# Patient Record
Sex: Male | Born: 1970 | Race: White | Hispanic: No | Smoking: Never smoker
Health system: Southern US, Community
[De-identification: ages and names within clinical notes are randomized; demographics above are authoritative.]

## PROBLEM LIST (undated history)

## (undated) DIAGNOSIS — F1090 Alcohol use, unspecified, uncomplicated: Secondary | ICD-10-CM

## (undated) DIAGNOSIS — M549 Dorsalgia, unspecified: Secondary | ICD-10-CM

## (undated) DIAGNOSIS — F1910 Other psychoactive substance abuse, uncomplicated: Secondary | ICD-10-CM

## (undated) DIAGNOSIS — M21372 Foot drop, left foot: Secondary | ICD-10-CM

## (undated) DIAGNOSIS — Z9889 Other specified postprocedural states: Secondary | ICD-10-CM

## (undated) DIAGNOSIS — D649 Anemia, unspecified: Secondary | ICD-10-CM

## (undated) DIAGNOSIS — D0420 Carcinoma in situ of skin of unspecified ear and external auricular canal: Secondary | ICD-10-CM

## (undated) HISTORY — PX: FACIAL FRACTURE SURGERY: SHX1570

## (undated) HISTORY — DX: Foot drop, left foot: M21.372

## (undated) HISTORY — PX: TRACHEOSTOMY: SUR1362

## (undated) HISTORY — DX: Other specified postprocedural states: Z98.890

## (undated) HISTORY — DX: Carcinoma in situ of skin of unspecified ear and external auricular canal: D04.20

## (undated) HISTORY — DX: Alcohol use, unspecified, uncomplicated: F10.90

## (undated) HISTORY — DX: Anemia, unspecified: D64.9

## (undated) HISTORY — DX: Other psychoactive substance abuse, uncomplicated: F19.10

---

## 2005-05-30 HISTORY — PX: FACIAL FRACTURE SURGERY: SHX1570

## 2016-11-14 ENCOUNTER — Emergency Department (HOSPITAL_COMMUNITY): Payer: Self-pay

## 2016-11-14 ENCOUNTER — Emergency Department (HOSPITAL_COMMUNITY)
Admission: EM | Admit: 2016-11-14 | Discharge: 2016-11-14 | Disposition: A | Discharge status: Home / Self Care | Payer: Self-pay | Attending: Emergency Medicine | Admitting: Emergency Medicine

## 2016-11-14 ENCOUNTER — Encounter (HOSPITAL_COMMUNITY): Payer: Self-pay

## 2016-11-14 DIAGNOSIS — N451 Epididymitis: Secondary | ICD-10-CM | POA: Insufficient documentation

## 2016-11-14 LAB — URINALYSIS, ROUTINE W REFLEX MICROSCOPIC
Bilirubin Urine: NEGATIVE
GLUCOSE, UA: NEGATIVE mg/dL
HGB URINE DIPSTICK: NEGATIVE
Ketones, ur: 5 mg/dL — AB
Nitrite: NEGATIVE
Protein, ur: NEGATIVE mg/dL
SQUAMOUS EPITHELIAL / LPF: NONE SEEN
Specific Gravity, Urine: 1.011 (ref 1.005–1.030)
pH: 7 (ref 5.0–8.0)

## 2016-11-14 MED ORDER — LIDOCAINE HCL (PF) 1 % IJ SOLN
2.0000 mL | Freq: Once | INTRAMUSCULAR | Status: AC
  Administered 2016-11-14: 0.9 mL
  Filled 2016-11-14: qty 5

## 2016-11-14 MED ORDER — CEFTRIAXONE SODIUM 250 MG IJ SOLR
250.0000 mg | Freq: Once | INTRAMUSCULAR | Status: AC
  Administered 2016-11-14: 250 mg via INTRAMUSCULAR
  Filled 2016-11-14: qty 250

## 2016-11-14 MED ORDER — DOXYCYCLINE HYCLATE 100 MG PO CAPS: 100.0000 mg | ORAL_CAPSULE | Freq: Two times a day (BID) | ORAL | 0 refills | Status: DC

## 2016-11-14 NOTE — ED Provider Notes (Signed)
[facility]-EMERGENCY DEPT Provider Note   CSN: [id number] Arrival date & time: 11/14/16  1050  By signing my name below, I, Zachary Romero, attest that this documentation has been prepared under the direction and in the presence of Rolan Bucco, MD. Electronically Signed: Modena Romero, Scribe. 11/14/2016. 2:06 PM.  History   Chief Complaint Chief Complaint  Patient presents with  . possible hernia?   The history is provided by the patient. No language interpreter was used.   HPI Comments: Zachary Romero is a 46 y.o. male who presents to the Emergency Department complaining of intermittent moderate right groin pain that started about a month ago. He states he has been exercising and initially suspected overuse strain. His pain worsened, becoming more persistent and spreading to his right testicle. He reports associated vomiting 3 days ago and mild back pain. He admits to unprotected intercourse, but with one partner, his girlfriend. Denies any hx of groin pain, groin swelling/lumps, pain with BM, fever, or other complaints at this time.  History reviewed. No pertinent past medical history.  There are no active problems to display for this patient.   History reviewed. No pertinent surgical history.     Home Medications    Prior to Admission medications   Medication Sig Start Date End Date Taking? Authorizing Provider  doxycycline (VIBRAMYCIN) 100 MG capsule Take 1 capsule (100 mg total) by mouth 2 (two) times daily. One po bid x 10 days 11/14/16   Rolan Bucco, MD    Family History No family history on file.  Social History Social History  Substance Use Topics  . Smoking status: Never Smoker  . Smokeless tobacco: Never Used  . Alcohol use Not on file     Allergies   Patient has no known allergies.   Review of Systems Review of Systems  Constitutional: Negative for chills, diaphoresis, fatigue and fever.  HENT: Negative for congestion, rhinorrhea and sneezing.   Eyes:  Negative.   Respiratory: Negative for cough, chest tightness and shortness of breath.   Cardiovascular: Negative for chest pain and leg swelling.  Gastrointestinal: Positive for vomiting. Negative for abdominal pain, blood in stool, diarrhea and nausea.  Genitourinary: Positive for testicular pain (right). Negative for difficulty urinating, flank pain, frequency, hematuria, penile swelling and scrotal swelling.  Musculoskeletal: Positive for back pain. Negative for arthralgias.  Skin: Negative for rash.  Neurological: Negative for dizziness, speech difficulty, weakness, numbness and headaches.     Physical Exam Updated Vital Signs BP (!) 142/79   Pulse 69   Temp 98.5 F (36.9 C) (Oral)   Resp 18   SpO2 98%   Physical Exam  Constitutional: He is oriented to person, place, and time. He appears well-developed and well-nourished.  HENT:  Head: Normocephalic and atraumatic.  Eyes: Pupils are equal, round, and reactive to light.  Neck: Normal range of motion. Neck supple.  Cardiovascular: Normal rate, regular rhythm and normal heart sounds.   Pulmonary/Chest: Effort normal and breath sounds normal. No respiratory distress. He has no wheezes. He has no rales. He exhibits no tenderness.  Abdominal: Soft. Bowel sounds are normal. There is no tenderness. There is no rebound and no guarding.  Genitourinary: Penis normal. Circumcised. No discharge found.  Genitourinary Comments: TTP over the right testicle and right epididymis. No swelling or abnormal lie of the testicle. No hernia palpated. Normal circumcised penis with no discharge.   Musculoskeletal: Normal range of motion. He exhibits no edema.  Lymphadenopathy:    He has no  cervical adenopathy.  Neurological: He is alert and oriented to person, place, and time.  Skin: Skin is warm and dry. No rash noted.  Psychiatric: He has a normal mood and affect.     ED Treatments / Results  DIAGNOSTIC STUDIES: Oxygen Saturation is 98% on RA,  normal by my interpretation.    COORDINATION OF CARE: 2:10 PM- Pt advised of plan for treatment and pt agrees.  Labs (all labs ordered are listed, but only abnormal results are displayed) Labs Reviewed  URINALYSIS, ROUTINE W REFLEX MICROSCOPIC - Abnormal; Notable for the following:       Result Value   Ketones, ur 5 (*)    Leukocytes, UA TRACE (*)    Bacteria, UA RARE (*)    All other components within normal limits    EKG  EKG Interpretation None       Radiology [place] Scrotum  Result Date: 11/14/2016 CLINICAL DATA:  Right scrotal pain. EXAM: SCROTAL ULTRASOUND DOPPLER ULTRASOUND OF THE TESTICLES TECHNIQUE: Complete ultrasound examination of the testicles, epididymis, and other scrotal structures was performed. Color and spectral Doppler ultrasound were also utilized to evaluate blood flow to the testicles. COMPARISON:  No recent prior . FINDINGS: Right testicle Measurements: 5.0 x 1.8 x 3.1 cm. Slightly heterogeneous parenchymal pattern. No mass or microlithiasis visualized. Left testicle Measurements: 4.7 x 2.3 x 2.9 cm. Slightly heterogeneous parenchymal pattern. No mass or microlithiasis visualized. Right epididymis:  Normal in size and appearance. Left epididymis:  Normal in size and appearance. Hydrocele:  Small bilateral hydroceles. Varicocele:  None visualized. Pulsed Doppler interrogation of both testes demonstrates normal low resistance arterial and venous waveforms bilaterally. IMPRESSION: No evidence of testicular mass or torsion. All bilateral hydroceles. Electronically Signed   By: Maisie Fushomas  Register   On: 11/14/2016 15:34   [place] Art/ven Flow Abd Pelv Doppler  Result Date: 11/14/2016 CLINICAL DATA:  Right scrotal pain. EXAM: SCROTAL ULTRASOUND DOPPLER ULTRASOUND OF THE TESTICLES TECHNIQUE: Complete ultrasound examination of the testicles, epididymis, and other scrotal structures was performed. Color and spectral Doppler ultrasound were also utilized to evaluate blood flow to the  testicles. COMPARISON:  No recent prior . FINDINGS: Right testicle Measurements: 5.0 x 1.8 x 3.1 cm. Slightly heterogeneous parenchymal pattern. No mass or microlithiasis visualized. Left testicle Measurements: 4.7 x 2.3 x 2.9 cm. Slightly heterogeneous parenchymal pattern. No mass or microlithiasis visualized. Right epididymis:  Normal in size and appearance. Left epididymis:  Normal in size and appearance. Hydrocele:  Small bilateral hydroceles. Varicocele:  None visualized. Pulsed Doppler interrogation of both testes demonstrates normal low resistance arterial and venous waveforms bilaterally. IMPRESSION: No evidence of testicular mass or torsion. All bilateral hydroceles. Electronically Signed   By: Maisie Fushomas  Register   On: 11/14/2016 15:34    Procedures Procedures (including critical care time)  Medications Ordered in ED Medications  cefTRIAXone (ROCEPHIN) injection 250 mg (not administered)     Initial Impression / Assessment and Plan / ED Course  I have reviewed the triage vital signs and the nursing notes.  Pertinent labs & imaging results that were available during my care of the patient were reviewed by me and considered in my medical decision making (see chart for details).     Patient's exam is consistent with epididymitis. I don't palpate a hernia. His urine is negative for infection or hematuria which would be less likely to indicate a kidney stone. I will treat him with Rocephin and doxycycline. He was encouraged to follow-up with the urologist. Return precautions were  given.  Final Clinical Impressions(s) / ED Diagnoses   Final diagnoses:  Epididymitis    New Prescriptions New Prescriptions   DOXYCYCLINE (VIBRAMYCIN) 100 MG CAPSULE    Take 1 capsule (100 mg total) by mouth 2 (two) times daily. One po bid x 10 days   I personally performed the services described in this documentation, which was scribed in my presence.  The recorded information has been reviewed and  considered.     Rolan Bucco, MD 11/14/16 1714

## 2016-11-14 NOTE — ED Triage Notes (Signed)
Patient here with right groin/scrotum pain intermittently x 1 month. States that the pain occurs when he lifts or with exertion. No distress on arrival

## 2016-11-14 NOTE — ED Notes (Signed)
Patient transported to [facility] 

## 2017-04-14 ENCOUNTER — Encounter (HOSPITAL_COMMUNITY): Payer: Self-pay | Admitting: Emergency Medicine

## 2017-04-14 ENCOUNTER — Other Ambulatory Visit: Payer: Self-pay

## 2017-04-14 ENCOUNTER — Emergency Department (HOSPITAL_COMMUNITY): Payer: No Typology Code available for payment source

## 2017-04-14 DIAGNOSIS — R0789 Other chest pain: Secondary | ICD-10-CM | POA: Diagnosis not present

## 2017-04-14 DIAGNOSIS — F1490 Cocaine use, unspecified, uncomplicated: Secondary | ICD-10-CM | POA: Insufficient documentation

## 2017-04-14 DIAGNOSIS — M25561 Pain in right knee: Secondary | ICD-10-CM | POA: Diagnosis present

## 2017-04-14 LAB — BASIC METABOLIC PANEL
Anion gap: 11 (ref 5–15)
BUN: 9 mg/dL (ref 6–20)
CO2: 25 mmol/L (ref 22–32)
Calcium: 9.1 mg/dL (ref 8.9–10.3)
Chloride: 101 mmol/L (ref 101–111)
Creatinine, Ser: 0.96 mg/dL (ref 0.61–1.24)
GFR calc Af Amer: >60 mL/min (ref >60)
GFR calc non Af Amer: >60 mL/min (ref >60)
Glucose, Bld: 96 mg/dL (ref 65–99)
Potassium: 3.6 mmol/L (ref 3.5–5.1)
Sodium: 137 mmol/L (ref 135–145)

## 2017-04-14 LAB — I-STAT TROPONIN, ED: Troponin i, poc: 0.01 ng/mL (ref 0.00–0.08)

## 2017-04-14 LAB — CBC
HCT: 49.1 % (ref 39.0–52.0)
Hemoglobin: 17.4 g/dL — ABNORMAL HIGH (ref 13.0–17.0)
MCH: 34.5 pg — ABNORMAL HIGH (ref 26.0–34.0)
MCHC: 35.4 g/dL (ref 30.0–36.0)
MCV: 97.4 fL (ref 78.0–100.0)
Platelets: 275 10*3/uL (ref 150–400)
RBC: 5.04 MIL/uL (ref 4.22–5.81)
RDW: 12.9 % (ref 11.5–15.5)
WBC: 7.3 10*3/uL (ref 4.0–10.5)
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
## 2017-04-15 ENCOUNTER — Emergency Department (HOSPITAL_COMMUNITY)
Admission: EM | Admit: 2017-04-15 | Discharge: 2017-04-15 | Disposition: A | Discharge status: Home / Self Care | Payer: No Typology Code available for payment source | Attending: Emergency Medicine | Admitting: Emergency Medicine

## 2017-04-15 DIAGNOSIS — R0789 Other chest pain: Secondary | ICD-10-CM

## 2017-04-15 DIAGNOSIS — M25561 Pain in right knee: Secondary | ICD-10-CM

## 2017-04-15 DIAGNOSIS — F1490 Cocaine use, unspecified, uncomplicated: Secondary | ICD-10-CM

## 2017-04-15 LAB — I-STAT TROPONIN, ED: Troponin i, poc: 0.01 ng/mL (ref 0.00–0.08)

## 2017-04-15 MED ORDER — IBUPROFEN 800 MG PO TABS: 800.0000 mg | ORAL_TABLET | Freq: Three times a day (TID) | ORAL | 0 refills | Status: DC | PRN

## 2017-04-15 MED ORDER — IBUPROFEN 800 MG PO TABS
800.0000 mg | ORAL_TABLET | Freq: Once | ORAL | Status: AC
  Administered 2017-04-15: 800 mg via ORAL
  Filled 2017-04-15: qty 1

## 2017-04-15 NOTE — ED Notes (Signed)
ED Provider at bedside. 
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
## 2017-04-15 NOTE — Discharge Instructions (Signed)
You may alternate Tylenol 1000 mg every 6 hours as needed for pain and Ibuprofen 800 mg every 8 hours as needed for pain.  Please take Ibuprofen with food. ° ° ° °To find a primary care or specialty doctor please call [phone] or [phone] to access "[facility] Find a Doctor Service." ° °You may also go on the [facility] website at [email or web address] ° °There are also multiple [facility], [facility], [facility] and [facility] practices throughout the [place] that are frequently accepting new patients. You may find a clinic that is close to your home and contact them. ° °[facility] -  °[address] °[place] [address] °[phone] ° ° °[facility] -  °[address] °[place] [address] [phone] ° ° °[facility] - °[address]  °[place] [address] °[phone] ° ° °

## 2017-05-13 ENCOUNTER — Encounter (HOSPITAL_COMMUNITY): Payer: Self-pay | Admitting: Emergency Medicine

## 2017-05-13 ENCOUNTER — Emergency Department (HOSPITAL_COMMUNITY): Payer: Self-pay

## 2017-05-13 ENCOUNTER — Other Ambulatory Visit: Payer: Self-pay

## 2017-05-13 ENCOUNTER — Emergency Department (HOSPITAL_COMMUNITY)
Admission: EM | Admit: 2017-05-13 | Discharge: 2017-05-13 | Disposition: A | Discharge status: Home / Self Care | Payer: Self-pay | Attending: Emergency Medicine | Admitting: Emergency Medicine

## 2017-05-13 DIAGNOSIS — F1410 Cocaine abuse, uncomplicated: Secondary | ICD-10-CM | POA: Insufficient documentation

## 2017-05-13 DIAGNOSIS — Z0389 Encounter for observation for other suspected diseases and conditions ruled out: Secondary | ICD-10-CM | POA: Insufficient documentation

## 2017-05-13 DIAGNOSIS — R0609 Other forms of dyspnea: Secondary | ICD-10-CM

## 2017-05-13 LAB — BASIC METABOLIC PANEL
ANION GAP: 8 (ref 5–15)
BUN: 11 mg/dL (ref 6–20)
CHLORIDE: 105 mmol/L (ref 101–111)
CO2: 23 mmol/L (ref 22–32)
Calcium: 8.9 mg/dL (ref 8.9–10.3)
Creatinine, Ser: 1.02 mg/dL (ref 0.61–1.24)
GFR calc non Af Amer: >60 mL/min (ref >60)
Glucose, Bld: 85 mg/dL (ref 65–99)
POTASSIUM: 4.1 mmol/L (ref 3.5–5.1)
Sodium: 136 mmol/L (ref 135–145)

## 2017-05-13 LAB — CBC
HEMATOCRIT: 47.7 % (ref 39.0–52.0)
HEMOGLOBIN: 16.4 g/dL (ref 13.0–17.0)
MCH: 33.7 pg (ref 26.0–34.0)
MCHC: 34.4 g/dL (ref 30.0–36.0)
MCV: 98.1 fL (ref 78.0–100.0)
Platelets: 271 10*3/uL (ref 150–400)
RBC: 4.86 MIL/uL (ref 4.22–5.81)
RDW: 12.8 % (ref 11.5–15.5)
WBC: 6.6 10*3/uL (ref 4.0–10.5)

## 2017-05-13 LAB — URINALYSIS, ROUTINE W REFLEX MICROSCOPIC
Bilirubin Urine: NEGATIVE
Glucose, UA: NEGATIVE mg/dL
HGB URINE DIPSTICK: NEGATIVE
Ketones, ur: 5 mg/dL — AB
LEUKOCYTES UA: NEGATIVE
NITRITE: NEGATIVE
PROTEIN: NEGATIVE mg/dL
Specific Gravity, Urine: 1.024 (ref 1.005–1.030)
pH: 6 (ref 5.0–8.0)

## 2017-05-13 LAB — RAPID URINE DRUG SCREEN, HOSP PERFORMED
AMPHETAMINES: NOT DETECTED
BENZODIAZEPINES: NOT DETECTED
Barbiturates: NOT DETECTED
COCAINE: POSITIVE — AB
OPIATES: NOT DETECTED
Tetrahydrocannabinol: NOT DETECTED

## 2017-05-13 LAB — I-STAT TROPONIN, ED
Troponin i, poc: 0.02 ng/mL (ref 0.00–0.08)
Troponin i, poc: 0.01 ng/mL (ref 0.00–0.08)

## 2017-05-13 LAB — D-DIMER, QUANTITATIVE: D-Dimer, Quant: 0.32 ug/mL-FEU (ref 0.00–0.50)

## 2017-05-13 NOTE — ED Provider Notes (Signed)
[facility]ENC[facility] Provider Note   CSN: [id number] Arrival date & time: 05/13/17  0156     History   Chief Complaint Chief Complaint  Patient presents with  . Knee Pain    needs MRI  . Shortness of Breath    HPI Zachary Romero is a 46 y.o. male.  Patient states he is not here for his knee pain.  States he is here for intermittent episodes of shortness of breath that he has had over the past 1 month.  He states these episodes come and go lasting for several hours at a time.  They can be at rest and he can be with exertion.  Normally he tries to relax and then go away after several hours.  Patient states he came to the ED around 7 PM with 1 of these episodes but then laid down until he checked in at 2 AM when the symptoms were getting better.  Denies any associated chest pain, cough or fever.  No nausea or vomiting.  No dizziness, diaphoresis, leg pain or leg swelling.  Does admit to ongoing cocaine use, last use was 2 days ago.  Does not smoke cigarettes.  Denies any cardiac history.  He denies any injection drug use.   The history is provided by the patient.  Knee Pain    Shortness of Breath  Pertinent negatives include no fever, no headaches, no rhinorrhea, no cough, no vomiting, no abdominal pain and no rash.    History reviewed. No pertinent past medical history.  There are no active problems to display for this patient.   History reviewed. No pertinent surgical history.     Home Medications    Prior to Admission medications   Medication Sig Start Date End Date Taking? Authorizing Provider  ibuprofen (ADVIL,MOTRIN) 800 MG tablet Take 1 tablet (800 mg total) every 8 (eight) hours as needed by mouth for mild pain. Patient not taking: Reported on 05/13/2017 04/15/17   Ward, Layla MawKristen N, DO    Family History History reviewed. No pertinent family history.  Social History Social History   Tobacco Use  . Smoking status: Never Smoker    . Smokeless tobacco: Never Used  Substance Use Topics  . Alcohol use: Yes  . Drug use: Yes    Types: Cocaine     Allergies   Patient has no known allergies.   Review of Systems Review of Systems  Constitutional: Negative for activity change, appetite change and fever.  HENT: Negative for congestion and rhinorrhea.   Eyes: Negative for visual disturbance.  Respiratory: Positive for shortness of breath. Negative for cough and chest tightness.   Gastrointestinal: Negative for abdominal pain, nausea and vomiting.  Genitourinary: Negative for dysuria, hematuria and testicular pain.  Musculoskeletal: Negative for arthralgias and myalgias.  Skin: Negative for rash.  Neurological: Negative for dizziness, weakness and headaches.   all other systems are negative except as noted in the HPI and PMH.     Physical Exam Updated Vital Signs BP (!) 130/59 (BP Location: Right Arm)   Pulse 73   Temp 97.9 F (36.6 C) (Oral)   Resp 15   Ht 6\' 7"  (2.007 m)   Wt 111.1 kg (245 lb)   SpO2 100%   BMI 27.60 kg/m   Physical Exam  Constitutional: He is oriented to person, place, and time. He appears well-developed and well-nourished. No distress.  HENT:  Head: Normocephalic and atraumatic.  Mouth/Throat: Oropharynx is clear and moist. No  oropharyngeal exudate.  Eyes: Conjunctivae and EOM are normal. Pupils are equal, round, and reactive to light.  Neck: Normal range of motion. Neck supple.  No meningismus.  Cardiovascular: Normal rate, regular rhythm, normal heart sounds and intact distal pulses.  No murmur heard. Pulmonary/Chest: Effort normal and breath sounds normal. No respiratory distress. He has no wheezes.  Abdominal: Soft. There is no tenderness. There is no rebound and no guarding.  Musculoskeletal: Normal range of motion. He exhibits no edema or tenderness.  Neurological: He is alert and oriented to person, place, and time. No cranial nerve deficit. He exhibits normal muscle tone.  Coordination normal.  No ataxia on finger to nose bilaterally. No pronator drift. 5/5 strength throughout. CN 2-12 intact.Equal grip strength. Sensation intact.   Skin: Skin is warm.  Psychiatric: He has a normal mood and affect. His behavior is normal.  Nursing note and vitals reviewed.    ED Treatments / Results  Labs (all labs ordered are listed, but only abnormal results are displayed) Labs Reviewed  URINALYSIS, ROUTINE W REFLEX MICROSCOPIC - Abnormal; Notable for the following components:      Result Value   Ketones, ur 5 (*)    All other components within normal limits  RAPID URINE DRUG SCREEN, HOSP PERFORMED - Abnormal; Notable for the following components:   Cocaine POSITIVE (*)    All other components within normal limits  CBC  BASIC METABOLIC PANEL  D-DIMER, QUANTITATIVE (NOT AT [facility])  I-STAT TROPONIN, ED  I-STAT TROPONIN, ED    EKG  EKG Interpretation  Date/Time:  Saturday May 13 2017 02:03:28 EST Ventricular Rate:  76 PR Interval:  160 QRS Duration: 102 QT Interval:  382 QTC Calculation: 429 R Axis:   88 Text Interpretation:  * Poor data quality, interpretation may be adversely affected Normal sinus rhythm Minimal voltage criteria for LVH, may be normal variant Borderline ECG No significant change was found Confirmed by Glynn Octave [phone]) on 05/13/2017 2:11:51 AM       Radiology Dg Chest 2 View  Result Date: 05/13/2017 CLINICAL DATA:  Acute onset of shortness of breath. EXAM: CHEST  2 VIEW COMPARISON:  Chest radiograph performed 04/14/2017 FINDINGS: The lungs are well-aerated and clear. There is no evidence of focal opacification, pleural effusion or pneumothorax. The heart is normal in size; the mediastinal contour is within normal limits. No acute osseous abnormalities are seen. IMPRESSION: No acute cardiopulmonary process seen. Electronically Signed   By: Roanna Raider M.D.   On: 05/13/2017 05:29    Procedures Procedures (including critical  care time)  Medications Ordered in ED Medications - No data to display   Initial Impression / Assessment and Plan / ED Course  I have reviewed the triage vital signs and the nursing notes.  Pertinent labs & imaging results that were available during my care of the patient were reviewed by me and considered in my medical decision making (see chart for details).    Patient with intermittent episodes of shortness of breath over the past month.  He has not at this time.  No chest pain, cough or fever.  Admits to ongoing cocaine use, last used 2 days ago. He is thinking it could be anxiety related. Episodes are not clearly exertional.   EKG is unchanged. Chest x-ray is negative, lungs are clear.  Troponin is negative.  Troponin negative x2.  D-dimer negative.  EKG unchanged.  Low suspicion for ACS or PE.  Discussed with patient he should follow-up with cardiology  for stress test.  Return to the ED if he develops exertional chest pain with diaphoresis, vomiting and shortness of breath. Discussed cessation of cocaine use.  Return precautions discussed.  Final Clinical Impressions(s) / ED Diagnoses   Final diagnoses:  Dyspnea on exertion  Cocaine abuse [facility](HCC)    ED Discharge Orders    None       Glynn Octaveancour, Chibueze Beasley, MD 05/13/17 [phone]

## 2017-05-13 NOTE — ED Notes (Signed)
Pt sats 94% after ambulating to room from lobby. Pt reports ongoing sob, worse when working in the gym. Pt primarily concerned with sob and not knee pain, states "I'm working on the MRI thing, I'm just worried about my SOB."

## 2017-05-13 NOTE — Discharge Instructions (Signed)
No evidence of heart attack or blood clot in the lung.  Follow-up with a cardiologist for stress test.  Stop using cocaine.  Return to the ED if you develop exertional chest pain, shortness of breath, sweating, nausea or any other concerns.
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
## 2019-05-31 IMAGING — CR DG CHEST 2V
3 series · 3 of 3 positions shown · non-contrast
Comparison: None.

CLINICAL DATA: Chest pain

EXAM:
CHEST  2 VIEW

[chest pa]
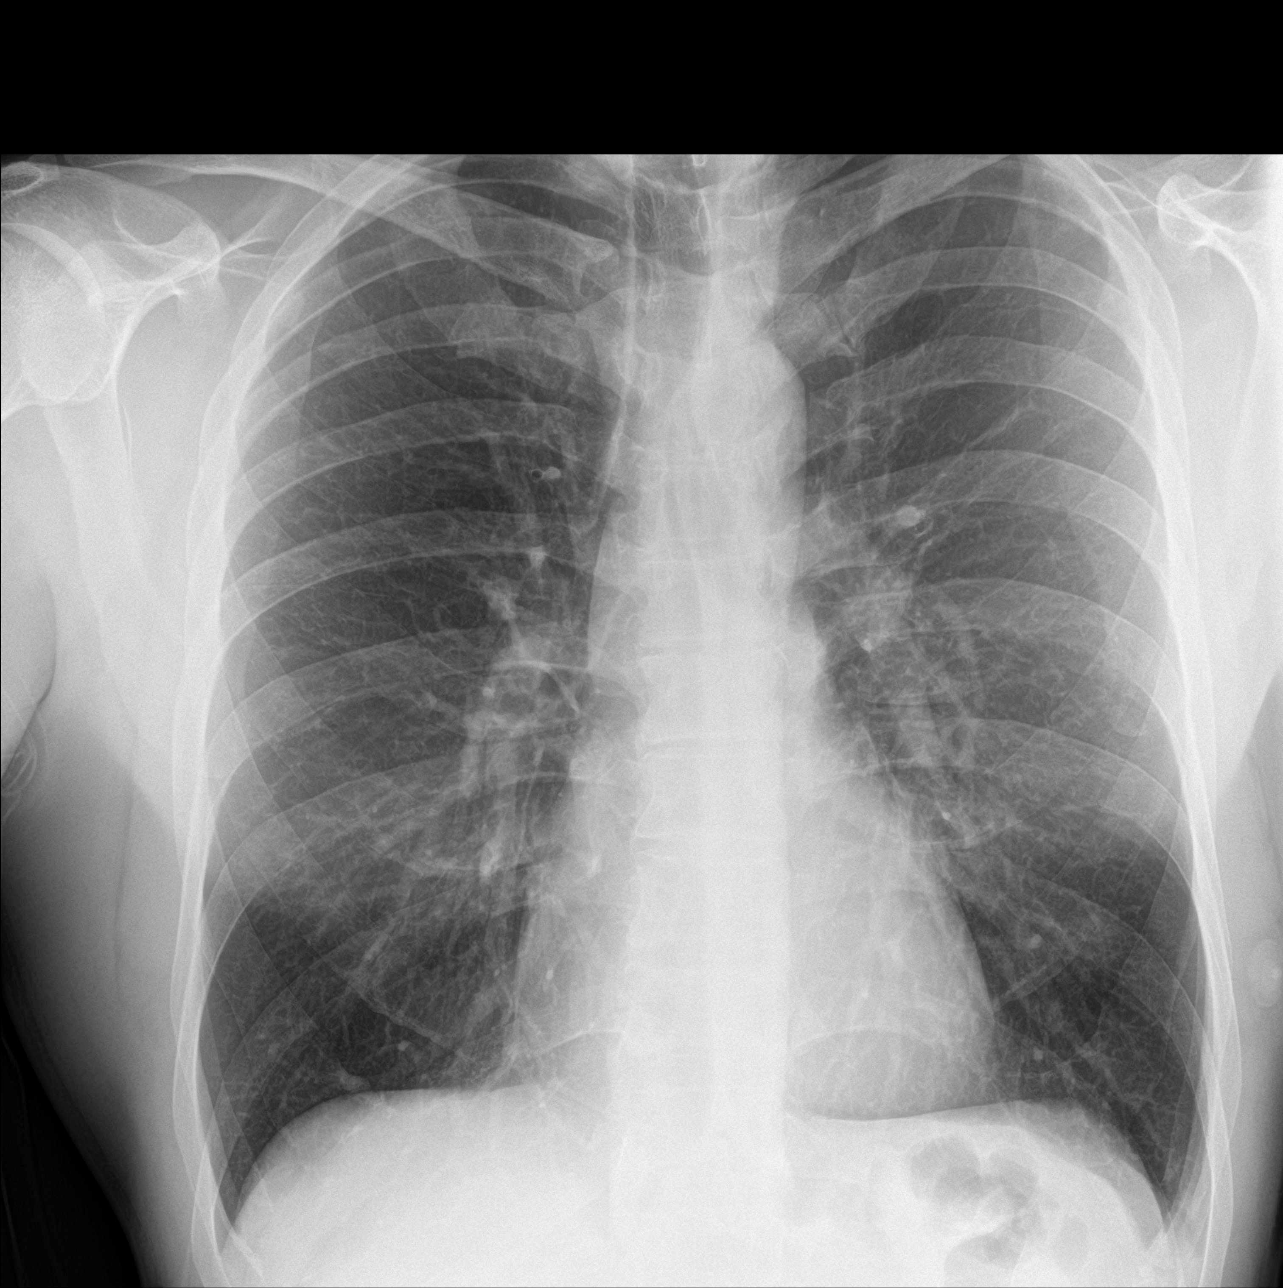

[chest lat (1 of 2)]
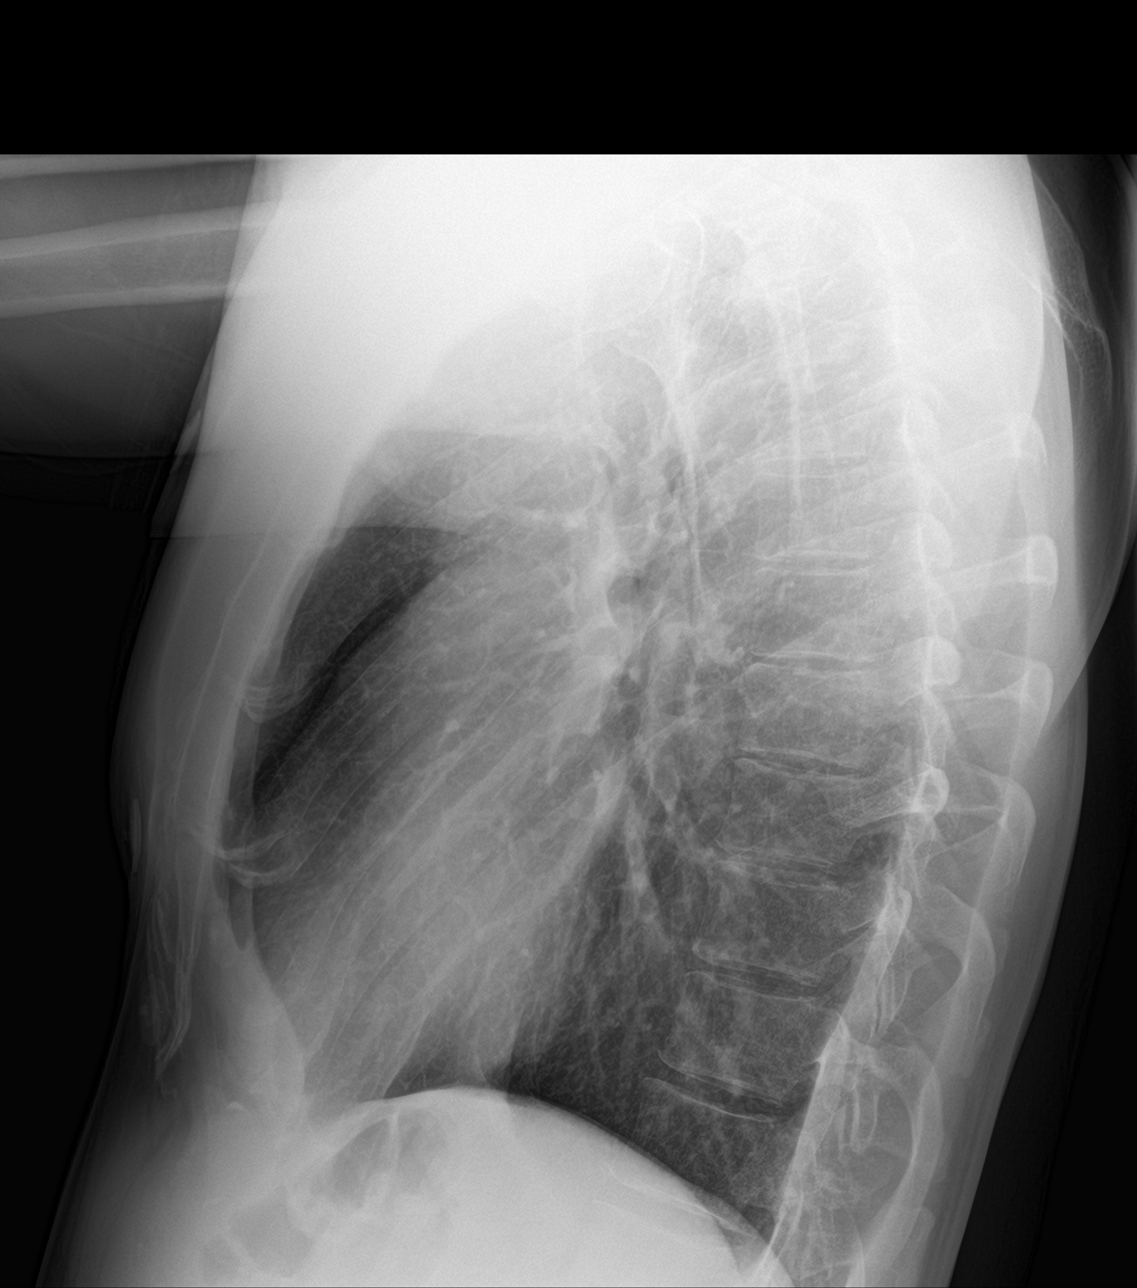

[chest lat (2 of 2)]
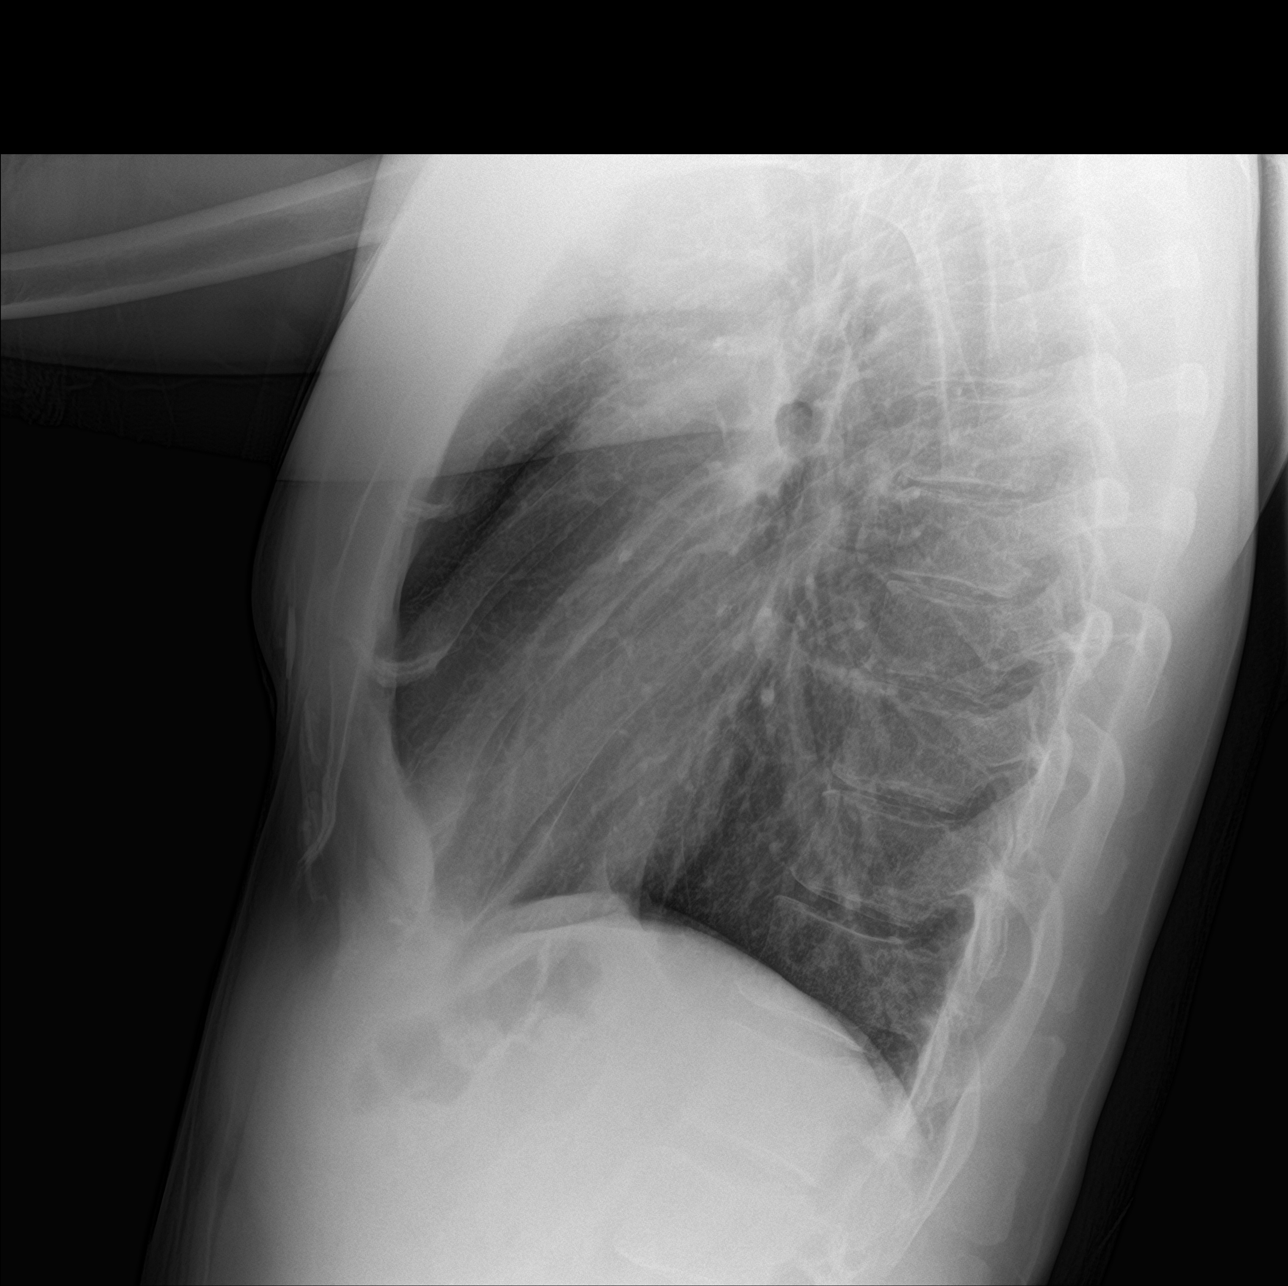

[3 of 3 positions shown; findings below may reference images not displayed]

FINDINGS: The heart size and mediastinal contours are within normal limits.
Both lungs are clear. The visualized skeletal structures are
unremarkable.
IMPRESSION: No active cardiopulmonary disease.

## 2020-09-21 ENCOUNTER — Observation Stay (HOSPITAL_COMMUNITY)
Admission: EM | Admit: 2020-09-21 | Discharge: 2020-09-22 | Disposition: A | Discharge status: Home / Self Care | Payer: BLUE CROSS/BLUE SHIELD | Attending: Neurosurgery | Admitting: Neurosurgery

## 2020-09-21 ENCOUNTER — Encounter (HOSPITAL_COMMUNITY): Payer: Self-pay | Admitting: *Deleted

## 2020-09-21 ENCOUNTER — Other Ambulatory Visit: Payer: Self-pay

## 2020-09-21 DIAGNOSIS — M545 Low back pain, unspecified: Secondary | ICD-10-CM | POA: Diagnosis present

## 2020-09-21 DIAGNOSIS — M5116 Intervertebral disc disorders with radiculopathy, lumbar region: Secondary | ICD-10-CM | POA: Diagnosis not present

## 2020-09-21 DIAGNOSIS — R29898 Other symptoms and signs involving the musculoskeletal system: Secondary | ICD-10-CM

## 2020-09-21 DIAGNOSIS — Z419 Encounter for procedure for purposes other than remedying health state, unspecified: Secondary | ICD-10-CM

## 2020-09-21 DIAGNOSIS — Z20822 Contact with and (suspected) exposure to covid-19: Secondary | ICD-10-CM | POA: Insufficient documentation

## 2020-09-21 DIAGNOSIS — M5126 Other intervertebral disc displacement, lumbar region: Secondary | ICD-10-CM | POA: Diagnosis present

## 2020-09-21 HISTORY — DX: Dorsalgia, unspecified: M54.9

## 2020-09-21 LAB — CBC WITH DIFFERENTIAL/PLATELET
Abs Immature Granulocytes: 0.02 10*3/uL (ref 0.00–0.07)
Basophils Absolute: 0 10*3/uL (ref 0.0–0.1)
Basophils Relative: 0 %
Eosinophils Absolute: 0.2 10*3/uL (ref 0.0–0.5)
Eosinophils Relative: 2 %
HCT: 48.3 % (ref 39.0–52.0)
Hemoglobin: 17 g/dL (ref 13.0–17.0)
Immature Granulocytes: 0 %
Lymphocytes Relative: 20 %
Lymphs Abs: 1.7 10*3/uL (ref 0.7–4.0)
MCH: 33.9 pg (ref 26.0–34.0)
MCHC: 35.2 g/dL (ref 30.0–36.0)
MCV: 96.2 fL (ref 80.0–100.0)
Monocytes Absolute: 0.9 10*3/uL (ref 0.1–1.0)
Monocytes Relative: 10 %
Neutro Abs: 5.7 10*3/uL (ref 1.7–7.7)
Neutrophils Relative %: 68 %
Platelets: 290 10*3/uL (ref 150–400)
RBC: 5.02 MIL/uL (ref 4.22–5.81)
RDW: 12.3 % (ref 11.5–15.5)
WBC: 8.5 10*3/uL (ref 4.0–10.5)
nRBC: 0 % (ref 0.0–0.2)

## 2020-09-21 LAB — COMPREHENSIVE METABOLIC PANEL
ALT: 27 U/L (ref 0–44)
AST: 25 U/L (ref 15–41)
Albumin: 3.9 g/dL (ref 3.5–5.0)
Alkaline Phosphatase: 53 U/L (ref 38–126)
Anion gap: 8 (ref 5–15)
BUN: 19 mg/dL (ref 6–20)
CO2: 26 mmol/L (ref 22–32)
Calcium: 9.2 mg/dL (ref 8.9–10.3)
Chloride: 102 mmol/L (ref 98–111)
Creatinine, Ser: 1.33 mg/dL — ABNORMAL HIGH (ref 0.61–1.24)
GFR, Estimated: >60 mL/min (ref >60)
Glucose, Bld: 84 mg/dL (ref 70–99)
Potassium: 3.8 mmol/L (ref 3.5–5.1)
Sodium: 136 mmol/L (ref 135–145)
Total Bilirubin: 0.8 mg/dL (ref 0.3–1.2)
Total Protein: 6.9 g/dL (ref 6.5–8.1)

## 2020-09-21 LAB — RESP PANEL BY RT-PCR (FLU A&B, COVID) ARPGX2
Influenza A by PCR: NEGATIVE
Influenza B by PCR: NEGATIVE
SARS Coronavirus 2 by RT PCR: NEGATIVE

## 2020-09-21 MED ORDER — METHOCARBAMOL 1000 MG/10ML IJ SOLN
1000.0000 mg | Freq: Once | INTRAVENOUS | Status: AC
  Administered 2020-09-21: 1000 mg via INTRAVENOUS
  Filled 2020-09-21: qty 1000

## 2020-09-21 MED ORDER — FENTANYL CITRATE (PF) 100 MCG/2ML IJ SOLN
100.0000 ug | Freq: Once | INTRAMUSCULAR | Status: AC
  Administered 2020-09-21: 100 ug via INTRAVENOUS
  Filled 2020-09-21: qty 2

## 2020-09-21 MED ORDER — HYDROMORPHONE HCL 1 MG/ML IJ SOLN
1.0000 mg | Freq: Once | INTRAMUSCULAR | Status: DC
  Filled 2020-09-21: qty 1

## 2020-09-21 MED ORDER — LORAZEPAM 2 MG/ML IJ SOLN
1.0000 mg | Freq: Once | INTRAMUSCULAR | Status: AC
  Administered 2020-09-21: 1 mg via INTRAVENOUS
  Filled 2020-09-21: qty 1

## 2020-09-21 MED ORDER — SODIUM CHLORIDE 0.9 % IV SOLN: INTRAVENOUS | Status: DC

## 2020-09-21 NOTE — ED Notes (Signed)
Report given to ED charge [facility]

## 2020-09-21 NOTE — ED Provider Notes (Signed)
[facility]-EMERGENCY DEPT Provider Note   CSN: [id number] Arrival date & time: 09/21/20  2032     History Chief Complaint  Patient presents with  . Back Pain    Zachary Romero is a 50 y.o. male.  50 year old male with history of L4-L5 disc herniation presents with severe pain to his lower back times several weeks.  Pain radiates down his left leg.  Denies any new trauma.  Has not seen his doctor for this and placed on prednisone, ibuprofen, gabapentin without relief.  Patient states he has had some issues with urinary frequency and is on Flomax for this.  No bladder incontinence.  Pain is been so severe that he has had trouble getting off the floor.  Is scheduled to have a spinal injection but due to the pain worsening came here        Past Medical History:  Diagnosis Date  . Back pain     There are no problems to display for this patient.   Past Surgical History:  Procedure Laterality Date  . FACIAL FRACTURE SURGERY         No family history on file.  Social History   Tobacco Use  . Smoking status: Never Smoker  . Smokeless tobacco: Never Used  Substance Use Topics  . Alcohol use: Yes  . Drug use: Yes    Types: Cocaine    Home Medications Prior to Admission medications   Medication Sig Start Date End Date Taking? Authorizing Provider  ibuprofen (ADVIL,MOTRIN) 800 MG tablet Take 1 tablet (800 mg total) every 8 (eight) hours as needed by mouth for mild pain. Patient not taking: Reported on 05/13/2017 04/15/17   Ward, Layla Maw, DO    Allergies    Patient has no known allergies.  Review of Systems   Review of Systems  All other systems reviewed and are negative.   Physical Exam Updated Vital Signs BP (!) 148/113 (BP Location: Left Arm)   Pulse (!) 104   Temp (!) 96.8 F (36 C) (Axillary)   Resp 16   SpO2 99%   Physical Exam Vitals and nursing note reviewed.  Constitutional:      General: He is not in acute distress.     Appearance: Normal appearance. He is well-developed. He is not toxic-appearing.  HENT:     Head: Normocephalic and atraumatic.  Eyes:     General: Lids are normal.     Conjunctiva/sclera: Conjunctivae normal.     Pupils: Pupils are equal, round, and reactive to light.  Neck:     Thyroid: No thyroid mass.     Trachea: No tracheal deviation.  Cardiovascular:     Rate and Rhythm: Normal rate and regular rhythm.     Heart sounds: Normal heart sounds. No murmur heard. No gallop.   Pulmonary:     Effort: Pulmonary effort is normal. No respiratory distress.     Breath sounds: Normal breath sounds. No stridor. No decreased breath sounds, wheezing, rhonchi or rales.  Abdominal:     General: Bowel sounds are normal. There is no distension.     Palpations: Abdomen is soft.     Tenderness: There is no abdominal tenderness. There is no rebound.  Musculoskeletal:        General: No tenderness. Normal range of motion.     Cervical back: Normal range of motion and neck supple.       Back:  Skin:    General: Skin is warm and  dry.     Findings: No abrasion or rash.  Neurological:     Mental Status: He is alert and oriented to person, place, and time.     GCS: GCS eye subscore is 4. GCS verbal subscore is 5. GCS motor subscore is 6.     Cranial Nerves: No cranial nerve deficit.     Sensory: No sensory deficit.     Comments: Strength is 3 of 5 at left leg.  5 of 5 and normal on right.  Patient able to ambulate but with noted left-foot weakness  Psychiatric:        Attention and Perception: Attention normal.        Speech: Speech normal.        Behavior: Behavior normal.     ED Results / Procedures / Treatments   Labs (all labs ordered are listed, but only abnormal results are displayed) Labs Reviewed  RESP PANEL BY RT-PCR (FLU A&B, COVID) [id number]  COMPREHENSIVE METABOLIC PANEL  CBC WITH DIFFERENTIAL/PLATELET    EKG None  Radiology No results found.  Procedures Procedures    Medications Ordered in ED Medications  fentaNYL (SUBLIMAZE) injection 100 mcg (has no administration in time range)  0.9 %  sodium chloride infusion (has no administration in time range)  HYDROmorphone (DILAUDID) injection 1 mg (has no administration in time range)  LORazepam (ATIVAN) injection 1 mg (has no administration in time range)  methocarbamol (ROBAXIN) 1,000 mg in dextrose 5 % 100 mL IVPB (has no administration in time range)    ED Course  I have reviewed the triage vital signs and the nursing notes.  Pertinent labs & imaging results that were available during my care of the patient were reviewed by me and considered in my medical decision making (see chart for details).    MDM Rules/Calculators/A&P                          Patient medicated for pain here.  Discussed with neurosurgery team and they recommend patient have MRI this evening.  Unfortunately, MRI is unavailable tonight.  Spoke to Dr. Jodi Mourning at [facility] who has accepted the patient in transfer.  Plan for this time is for patient to have his MRI of his LS spine and then consult neurosurgery if needed Final Clinical Impression(s) / ED Diagnoses Final diagnoses:  None    Rx / DC Orders ED Discharge Orders    None       Lorre Nick, MD 09/21/20 2211

## 2020-09-21 NOTE — ED Triage Notes (Signed)
Pt found face down in waiting area drenched in sweat due to severe pain.  Pt has a L4,L5 herniation and is waiting for surgery but lost control of his bowel and bladder and is in excruciating pain.

## 2020-09-21 NOTE — ED Triage Notes (Signed)
Emergency Medicine Provider Triage Evaluation Note  Zachary Romero , a 50 y.o. male  was evaluated in triage.  Pt complains of bladder incontinence.  Patient states he has a history of L4-L5 disc herniation.  He states that he was evaluated for this at [facility] health.  He was currently weaning off of pain meds to have a cortisone injection in 2 days.  He states that earlier today he spontaneously lost control of his bladder and began having worsening numbness, weakness, as well as radiating pain to the left leg.  Denies any chest pain or shortness of breath.  Physical Exam  BP (!) 148/113 (BP Location: Left Arm)   Pulse (!) 104   Temp (!) 96.8 F (36 C) (Axillary)   Resp 16   SpO2 99%  Gen:   Awake, diaphoretic HEENT:  Atraumatic  Resp:  Normal effort  Cardiac:  Normal rate  Abd:   Nondistended, nontender  MSK:   Moves extremities without difficulty, strength 4/5 in the left leg and 5/5 in the right.  Subjective diffuse decrease in sensation across the left lower extremity.  No midline spine pain. Neuro:  Speech clear   Medical Decision Making  Medically screening exam initiated at 8:51 PM.  Appropriate orders placed.  Omauri Boeve Viswanathan was informed that the remainder of the evaluation will be completed by another provider, this initial triage assessment does not replace that evaluation, and the importance of remaining in the ED until their evaluation is complete.   Placido Sou, PA-C 09/21/20 2053

## 2020-09-22 ENCOUNTER — Observation Stay (HOSPITAL_COMMUNITY): Payer: BLUE CROSS/BLUE SHIELD | Admitting: Anesthesiology

## 2020-09-22 ENCOUNTER — Emergency Department (HOSPITAL_COMMUNITY): Payer: BLUE CROSS/BLUE SHIELD

## 2020-09-22 ENCOUNTER — Encounter (HOSPITAL_COMMUNITY)
Admission: EM | Disposition: A | Discharge status: Home / Self Care | Payer: Self-pay | Source: Home / Self Care | Attending: Emergency Medicine

## 2020-09-22 ENCOUNTER — Observation Stay (HOSPITAL_COMMUNITY): Payer: BLUE CROSS/BLUE SHIELD

## 2020-09-22 DIAGNOSIS — M5126 Other intervertebral disc displacement, lumbar region: Secondary | ICD-10-CM | POA: Diagnosis present

## 2020-09-22 HISTORY — PX: LUMBAR LAMINECTOMY/DECOMPRESSION MICRODISCECTOMY: SHX5026

## 2020-09-22 LAB — HIV ANTIBODY (ROUTINE TESTING W REFLEX): HIV Screen 4th Generation wRfx: NONREACTIVE

## 2020-09-22 LAB — SURGICAL PCR SCREEN
MRSA, PCR: NEGATIVE
Staphylococcus aureus: POSITIVE — AB

## 2020-09-22 SURGERY — LUMBAR LAMINECTOMY/DECOMPRESSION MICRODISCECTOMY 1 LEVEL
Anesthesia: General | Site: Spine Lumbar | Laterality: Left

## 2020-09-22 MED ORDER — DEXAMETHASONE SODIUM PHOSPHATE 10 MG/ML IJ SOLN
INTRAMUSCULAR | Status: AC
  Filled 2020-09-22: qty 2

## 2020-09-22 MED ORDER — GABAPENTIN 100 MG PO CAPS
200.0000 mg | ORAL_CAPSULE | Freq: Three times a day (TID) | ORAL | Status: DC
  Administered 2020-09-22 (×2): 200 mg via ORAL
  Filled 2020-09-22 (×2): qty 2

## 2020-09-22 MED ORDER — PHENOL 1.4 % MT LIQD: 1.0000 | OROMUCOSAL | Status: DC | PRN

## 2020-09-22 MED ORDER — OXYCODONE HCL 5 MG PO TABS
5.0000 mg | ORAL_TABLET | ORAL | Status: DC | PRN
  Administered 2020-09-22: 5 mg via ORAL
  Filled 2020-09-22: qty 1

## 2020-09-22 MED ORDER — ONDANSETRON HCL 4 MG/2ML IJ SOLN
INTRAMUSCULAR | Status: DC | PRN
  Administered 2020-09-22: 4 mg via INTRAVENOUS

## 2020-09-22 MED ORDER — CEFAZOLIN SODIUM-DEXTROSE 2-4 GM/100ML-% IV SOLN
2.0000 g | INTRAVENOUS | Status: AC
  Administered 2020-09-22: 2 g via INTRAVENOUS
  Filled 2020-09-22: qty 100

## 2020-09-22 MED ORDER — THROMBIN 5000 UNITS EX SOLR
CUTANEOUS | Status: DC | PRN
  Administered 2020-09-22: 5000 [IU] via TOPICAL

## 2020-09-22 MED ORDER — CYCLOBENZAPRINE HCL 10 MG PO TABS
10.0000 mg | ORAL_TABLET | Freq: Three times a day (TID) | ORAL | Status: DC | PRN
  Administered 2020-09-22: 10 mg via ORAL
  Filled 2020-09-22: qty 1

## 2020-09-22 MED ORDER — KETOROLAC TROMETHAMINE 30 MG/ML IJ SOLN
INTRAMUSCULAR | Status: DC | PRN
  Administered 2020-09-22: 30 mg via INTRAVENOUS

## 2020-09-22 MED ORDER — SODIUM CHLORIDE 0.9 % IV SOLN: 250.0000 mL | INTRAVENOUS | Status: DC

## 2020-09-22 MED ORDER — FENTANYL CITRATE (PF) 250 MCG/5ML IJ SOLN
INTRAMUSCULAR | Status: AC
  Filled 2020-09-22: qty 5

## 2020-09-22 MED ORDER — HYDROCODONE-ACETAMINOPHEN 10-325 MG PO TABS
1.0000 | ORAL_TABLET | ORAL | Status: DC | PRN
  Administered 2020-09-22: 1 via ORAL
  Filled 2020-09-22: qty 1

## 2020-09-22 MED ORDER — ACETAMINOPHEN 650 MG RE SUPP: 650.0000 mg | RECTAL | Status: DC | PRN

## 2020-09-22 MED ORDER — 0.9 % SODIUM CHLORIDE (POUR BTL) OPTIME
TOPICAL | Status: DC | PRN
  Administered 2020-09-22: 1000 mL

## 2020-09-22 MED ORDER — ERYTHROMYCIN 5 MG/GM OP OINT
1.0000 "application " | TOPICAL_OINTMENT | Freq: Once | OPHTHALMIC | Status: DC
  Filled 2020-09-22: qty 3.5

## 2020-09-22 MED ORDER — DEXAMETHASONE SODIUM PHOSPHATE 10 MG/ML IJ SOLN
INTRAMUSCULAR | Status: DC | PRN
  Administered 2020-09-22: 10 mg via INTRAVENOUS

## 2020-09-22 MED ORDER — ONDANSETRON HCL 4 MG/2ML IJ SOLN: 4.0000 mg | Freq: Four times a day (QID) | INTRAMUSCULAR | Status: DC | PRN

## 2020-09-22 MED ORDER — FLEET ENEMA 7-19 GM/118ML RE ENEM: 1.0000 | ENEMA | Freq: Once | RECTAL | Status: DC | PRN

## 2020-09-22 MED ORDER — SODIUM CHLORIDE 0.9% FLUSH: 3.0000 mL | INTRAVENOUS | Status: DC | PRN

## 2020-09-22 MED ORDER — MENTHOL 3 MG MT LOZG: 1.0000 | LOZENGE | OROMUCOSAL | Status: DC | PRN

## 2020-09-22 MED ORDER — LACTATED RINGERS IV SOLN: INTRAVENOUS | Status: DC

## 2020-09-22 MED ORDER — KETOROLAC TROMETHAMINE 15 MG/ML IJ SOLN: 30.0000 mg | Freq: Four times a day (QID) | INTRAMUSCULAR | Status: DC

## 2020-09-22 MED ORDER — SUGAMMADEX SODIUM 200 MG/2ML IV SOLN
INTRAVENOUS | Status: DC | PRN
  Administered 2020-09-22: 400 mg via INTRAVENOUS

## 2020-09-22 MED ORDER — BISACODYL 10 MG RE SUPP: 10.0000 mg | Freq: Every day | RECTAL | Status: DC | PRN

## 2020-09-22 MED ORDER — THROMBIN 5000 UNITS EX SOLR
CUTANEOUS | Status: AC
  Filled 2020-09-22: qty 5000

## 2020-09-22 MED ORDER — ACETAMINOPHEN 325 MG PO TABS: 650.0000 mg | ORAL_TABLET | Freq: Four times a day (QID) | ORAL | Status: DC | PRN

## 2020-09-22 MED ORDER — POLYETHYLENE GLYCOL 3350 17 G PO PACK: 17.0000 g | PACK | Freq: Every day | ORAL | Status: DC | PRN

## 2020-09-22 MED ORDER — ACETAMINOPHEN 650 MG RE SUPP: 650.0000 mg | Freq: Four times a day (QID) | RECTAL | Status: DC | PRN

## 2020-09-22 MED ORDER — ONDANSETRON HCL 4 MG PO TABS: 4.0000 mg | ORAL_TABLET | Freq: Four times a day (QID) | ORAL | Status: DC | PRN

## 2020-09-22 MED ORDER — TAMSULOSIN HCL 0.4 MG PO CAPS
0.4000 mg | ORAL_CAPSULE | Freq: Every day | ORAL | Status: DC
  Administered 2020-09-22: 0.4 mg via ORAL
  Filled 2020-09-22: qty 1

## 2020-09-22 MED ORDER — ROCURONIUM BROMIDE 10 MG/ML (PF) SYRINGE
PREFILLED_SYRINGE | INTRAVENOUS | Status: AC
  Filled 2020-09-22: qty 20

## 2020-09-22 MED ORDER — SUGAMMADEX SODIUM 500 MG/5ML IV SOLN
INTRAVENOUS | Status: AC
  Filled 2020-09-22: qty 10

## 2020-09-22 MED ORDER — BUPIVACAINE HCL (PF) 0.25 % IJ SOLN
INTRAMUSCULAR | Status: AC
  Filled 2020-09-22: qty 30

## 2020-09-22 MED ORDER — HYDROMORPHONE HCL 1 MG/ML IJ SOLN: 1.0000 mg | INTRAMUSCULAR | Status: DC | PRN

## 2020-09-22 MED ORDER — PROPOFOL 10 MG/ML IV BOLUS
INTRAVENOUS | Status: DC | PRN
  Administered 2020-09-22: 200 mg via INTRAVENOUS

## 2020-09-22 MED ORDER — ACETAMINOPHEN 325 MG PO TABS: 650.0000 mg | ORAL_TABLET | ORAL | Status: DC | PRN

## 2020-09-22 MED ORDER — CHLORHEXIDINE GLUCONATE 0.12 % MT SOLN
15.0000 mL | Freq: Once | OROMUCOSAL | Status: AC
  Filled 2020-09-22: qty 15

## 2020-09-22 MED ORDER — MIDAZOLAM HCL 2 MG/2ML IJ SOLN
INTRAMUSCULAR | Status: AC
  Filled 2020-09-22: qty 2

## 2020-09-22 MED ORDER — HYDROMORPHONE HCL 1 MG/ML IJ SOLN
0.5000 mg | INTRAMUSCULAR | Status: DC | PRN
  Administered 2020-09-22: 1 mg via INTRAVENOUS
  Filled 2020-09-22: qty 1

## 2020-09-22 MED ORDER — CHLORHEXIDINE GLUCONATE 0.12 % MT SOLN
OROMUCOSAL | Status: AC
  Administered 2020-09-22: 15 mL via OROMUCOSAL
  Filled 2020-09-22: qty 15

## 2020-09-22 MED ORDER — PROPOFOL 10 MG/ML IV BOLUS
INTRAVENOUS | Status: AC
  Filled 2020-09-22: qty 20

## 2020-09-22 MED ORDER — ROCURONIUM BROMIDE 10 MG/ML (PF) SYRINGE
PREFILLED_SYRINGE | INTRAVENOUS | Status: DC | PRN
  Administered 2020-09-22: 40 mg via INTRAVENOUS
  Administered 2020-09-22: 60 mg via INTRAVENOUS

## 2020-09-22 MED ORDER — BUPIVACAINE HCL (PF) 0.25 % IJ SOLN
INTRAMUSCULAR | Status: DC | PRN
  Administered 2020-09-22: 20 mL

## 2020-09-22 MED ORDER — ONDANSETRON HCL 4 MG/2ML IJ SOLN
INTRAMUSCULAR | Status: AC
  Filled 2020-09-22: qty 4

## 2020-09-22 MED ORDER — LIDOCAINE 2% (20 MG/ML) 5 ML SYRINGE
INTRAMUSCULAR | Status: AC
  Filled 2020-09-22: qty 10

## 2020-09-22 MED ORDER — HYDROCODONE-ACETAMINOPHEN 5-325 MG PO TABS: 1.0000 | ORAL_TABLET | ORAL | Status: DC | PRN

## 2020-09-22 MED ORDER — FENTANYL CITRATE (PF) 100 MCG/2ML IJ SOLN
INTRAMUSCULAR | Status: DC | PRN
  Administered 2020-09-22: 100 ug via INTRAVENOUS
  Administered 2020-09-22: 50 ug via INTRAVENOUS

## 2020-09-22 MED ORDER — LIDOCAINE 2% (20 MG/ML) 5 ML SYRINGE
INTRAMUSCULAR | Status: DC | PRN
  Administered 2020-09-22: 60 mg via INTRAVENOUS

## 2020-09-22 MED ORDER — SODIUM CHLORIDE 0.9% FLUSH: 3.0000 mL | Freq: Two times a day (BID) | INTRAVENOUS | Status: DC

## 2020-09-22 MED ORDER — SODIUM CHLORIDE 0.9 % IV SOLN: INTRAVENOUS | Status: DC

## 2020-09-22 MED ORDER — OXYCODONE HCL 5 MG PO TABS: 5.0000 mg | ORAL_TABLET | ORAL | 0 refills | Status: DC | PRN

## 2020-09-22 MED ORDER — SODIUM CHLORIDE 0.9 % IV SOLN: 1.0000 g | Freq: Three times a day (TID) | INTRAVENOUS | Status: DC

## 2020-09-22 MED ORDER — HYDROMORPHONE HCL 1 MG/ML IJ SOLN
1.0000 mg | Freq: Once | INTRAMUSCULAR | Status: AC
  Administered 2020-09-22: 1 mg via INTRAVENOUS
  Filled 2020-09-22: qty 1

## 2020-09-22 MED ORDER — DOCUSATE SODIUM 100 MG PO CAPS
100.0000 mg | ORAL_CAPSULE | Freq: Two times a day (BID) | ORAL | Status: DC
Start: 2020-09-22 — End: 2020-09-22
  Administered 2020-09-22: 100 mg via ORAL
  Filled 2020-09-22: qty 1

## 2020-09-22 MED ORDER — MUPIROCIN 2 % EX OINT: 1.0000 "application " | TOPICAL_OINTMENT | Freq: Two times a day (BID) | CUTANEOUS | Status: DC

## 2020-09-22 MED ORDER — BUPIVACAINE HCL (PF) 0.5 % IJ SOLN
INTRAMUSCULAR | Status: AC
  Filled 2020-09-22: qty 30

## 2020-09-22 MED ORDER — MIDAZOLAM HCL 5 MG/5ML IJ SOLN
INTRAMUSCULAR | Status: DC | PRN
  Administered 2020-09-22: 2 mg via INTRAVENOUS

## 2020-09-22 MED ORDER — CYCLOBENZAPRINE HCL 10 MG PO TABS: 10.0000 mg | ORAL_TABLET | Freq: Three times a day (TID) | ORAL | 0 refills | Status: DC | PRN

## 2020-09-22 SURGICAL SUPPLY — 50 items
BAG DECANTER FOR FLEXI CONT (MISCELLANEOUS) ×2 IMPLANT
BAND RUBBER #18 3X1/16 STRL (MISCELLANEOUS) ×4 IMPLANT
BENZOIN TINCTURE PRP APPL 2/3 (GAUZE/BANDAGES/DRESSINGS) ×2 IMPLANT
BLADE CLIPPER SURG (BLADE) IMPLANT
BUR CUTTER 7.0 ROUND (BURR) ×2 IMPLANT
CANISTER SUCT 3000ML PPV (MISCELLANEOUS) ×2 IMPLANT
CARTRIDGE OIL MAESTRO DRILL (MISCELLANEOUS) ×1 IMPLANT
CLSR STERI-STRIP ANTIMIC 1/2X4 (GAUZE/BANDAGES/DRESSINGS) ×2 IMPLANT
COVER WAND RF STERILE (DRAPES) ×2 IMPLANT
DECANTER SPIKE VIAL GLASS SM (MISCELLANEOUS) IMPLANT
DERMABOND ADVANCED (GAUZE/BANDAGES/DRESSINGS) ×1
DERMABOND ADVANCED .7 DNX12 (GAUZE/BANDAGES/DRESSINGS) ×1 IMPLANT
DIFFUSER DRILL AIR PNEUMATIC (MISCELLANEOUS) ×2 IMPLANT
DRAPE HALF SHEET 40X57 (DRAPES) IMPLANT
DRAPE LAPAROTOMY 100X72X124 (DRAPES) ×2 IMPLANT
DRAPE MICROSCOPE LEICA (MISCELLANEOUS) ×2 IMPLANT
DRAPE SURG 17X23 STRL (DRAPES) ×4 IMPLANT
DRSG OPSITE POSTOP 3X4 (GAUZE/BANDAGES/DRESSINGS) ×2 IMPLANT
DURAPREP 26ML APPLICATOR (WOUND CARE) ×2 IMPLANT
ELECT REM PT RETURN 9FT ADLT (ELECTROSURGICAL) ×2
ELECTRODE REM PT RTRN 9FT ADLT (ELECTROSURGICAL) ×1 IMPLANT
GAUZE 4X4 16PLY RFD (DISPOSABLE) IMPLANT
GAUZE SPONGE 4X4 12PLY STRL (GAUZE/BANDAGES/DRESSINGS) ×2 IMPLANT
GLOVE BIO SURGEON STRL SZ 6.5 (GLOVE) ×2 IMPLANT
GLOVE ECLIPSE 9.0 STRL (GLOVE) ×2 IMPLANT
GLOVE EXAM NITRILE XL STR (GLOVE) IMPLANT
GLOVE SURG POLYISO LF SZ7 (GLOVE) ×2 IMPLANT
GLOVE SURG PR MICRO ENCORE 7 (GLOVE) ×4 IMPLANT
GLOVE SURG UNDER POLY LF SZ6.5 (GLOVE) ×4 IMPLANT
GLOVE SURG UNDER POLY LF SZ7.5 (GLOVE) ×4 IMPLANT
GOWN STRL REUS W/ TWL LRG LVL3 (GOWN DISPOSABLE) ×2 IMPLANT
GOWN STRL REUS W/ TWL XL LVL3 (GOWN DISPOSABLE) ×1 IMPLANT
GOWN STRL REUS W/TWL 2XL LVL3 (GOWN DISPOSABLE) IMPLANT
GOWN STRL REUS W/TWL LRG LVL3 (GOWN DISPOSABLE) ×2
GOWN STRL REUS W/TWL XL LVL3 (GOWN DISPOSABLE) ×1
KIT BASIN OR (CUSTOM PROCEDURE TRAY) ×2 IMPLANT
KIT TURNOVER KIT B (KITS) ×2 IMPLANT
NEEDLE HYPO 22GX1.5 SAFETY (NEEDLE) ×2 IMPLANT
NEEDLE SPNL 22GX3.5 QUINCKE BK (NEEDLE) IMPLANT
NS IRRIG 1000ML POUR BTL (IV SOLUTION) ×2 IMPLANT
OIL CARTRIDGE MAESTRO DRILL (MISCELLANEOUS) ×2
PACK LAMINECTOMY NEURO (CUSTOM PROCEDURE TRAY) ×2 IMPLANT
PAD ARMBOARD 7.5X6 YLW CONV (MISCELLANEOUS) ×6 IMPLANT
SPONGE SURGIFOAM ABS GEL SZ50 (HEMOSTASIS) ×2 IMPLANT
STRIP CLOSURE SKIN 1/2X4 (GAUZE/BANDAGES/DRESSINGS) ×2 IMPLANT
SUT VIC AB 2-0 CT1 18 (SUTURE) ×2 IMPLANT
SUT VIC AB 3-0 SH 8-18 (SUTURE) ×2 IMPLANT
TOWEL GREEN STERILE (TOWEL DISPOSABLE) ×2 IMPLANT
TOWEL GREEN STERILE FF (TOWEL DISPOSABLE) ×2 IMPLANT
WATER STERILE IRR 1000ML POUR (IV SOLUTION) ×2 IMPLANT

## 2020-09-22 NOTE — Anesthesia Preprocedure Evaluation (Signed)
Anesthesia Evaluation  Patient identified by MRN, date of birth, ID band Patient awake    Reviewed: Allergy & Precautions, NPO status , Patient's Chart, lab work & pertinent test results  Airway Mallampati: II  TM Distance: >3 FB Neck ROM: Full    Dental  (+) Dental Advisory Given   Pulmonary neg pulmonary ROS,    breath sounds clear to auscultation       Cardiovascular negative cardio ROS   Rhythm:Regular Rate:Normal     Neuro/Psych  Neuromuscular disease    GI/Hepatic negative GI ROS, Neg liver ROS,   Endo/Other  negative endocrine ROS  Renal/GU Renal InsufficiencyRenal disease     Musculoskeletal   Abdominal   Peds  Hematology negative hematology ROS (+)   Anesthesia Other Findings   Reproductive/Obstetrics                             Anesthesia Physical Anesthesia Plan  ASA: II  Anesthesia Plan: General   Post-op Pain Management:    Induction: Intravenous  PONV Risk Score and Plan: 2 and Dexamethasone, Ondansetron, Midazolam and Treatment may vary due to age or medical condition  Airway Management Planned: Oral ETT  Additional Equipment: None  Intra-op Plan:   Post-operative Plan: Extubation in OR  Informed Consent: I have reviewed the patients History and Physical, chart, labs and discussed the procedure including the risks, benefits and alternatives for the proposed anesthesia with the patient or authorized representative who has indicated his/her understanding and acceptance.     Dental advisory given  Plan Discussed with: CRNA  Anesthesia Plan Comments:         Anesthesia Quick Evaluation

## 2020-09-22 NOTE — H&P (Signed)
Zachary Romero is an 50 y.o. male.   Chief Complaint: Low back pain HPI: Patient is a 50 year old male with a history of cocaine use and back pain. He reports back pain for several years. Approximately one month ago, his symptoms started worsening. He developed pain into his left anterior thigh to his shin. He took ibuprofen for his symptoms without much relief. About two weeks ago, his symptoms significantly worsened. He had an increase in his radicular symptoms. He describes some urinary incontinence, but no saddle paresthesia or loss of bowel control. He was seen at [facility] where imaging showed a small left-sided disc extrusion. He prescribed a five day supply of Vicodin and started on gabapentin and a steroid taper. An epidural steroid injection was scheduled and he was advised to come off of the steroid and gabapentin five days prior. His symptoms became so severe without medication, that he presented to [facility] emergency department on the evening of 09/21/2020. As his symptoms had worsened, the patient was transferred to [facility] so a new MRI could be performed. Lumbar MRI showed an L4-L5 10 mm disc extrusion into the left lateral recess causing severe lateral recess stenosis. Patient was admitted for surgical planning. He has been NPO since approximately 1 am.  Past Medical History:  Diagnosis Date  . Back pain     Past Surgical History:  Procedure Laterality Date  . FACIAL FRACTURE SURGERY      No family history on file. Social History:  reports that he has never smoked. He has never used smokeless tobacco. He reports current alcohol use. He reports current drug use. Drug: Cocaine.  Allergies: No Known Allergies  Medications Prior to Admission  Medication Sig Dispense Refill  . erythromycin ophthalmic ointment Place 1 application into the right eye once.    . gabapentin (NEURONTIN) 100 MG capsule Take 100 mg by mouth. For 2 days 1 capsule by mouth every 8 hours, then increase to 200mg  every  8 hours, may increase to 300mg  every 8 hours after 4 days    . ibuprofen (ADVIL,MOTRIN) 800 MG tablet Take 1 tablet (800 mg total) every 8 (eight) hours as needed by mouth for mild pain. 30 tablet 0  . predniSONE (STERAPRED UNI-PAK 48 TAB) 10 MG (48) TBPK tablet Take by mouth as directed. 48 Qty , 12 day taper    . sildenafil (VIAGRA) 100 MG tablet Take 100 mg by mouth daily as needed for erectile dysfunction.    . tamsulosin (FLOMAX) 0.4 MG CAPS capsule Take 0.4 mg by mouth daily.    HYDROcodone-acetaminophen (NORCO/VICODIN) 5-325 MG tablet Take 1 tablet by mouth every 6 (six) hours as needed.      Results for orders placed or performed during the hospital encounter of 09/21/20 (from the past 48 hour(s))  Comprehensive metabolic panel     Status: Abnormal   Collection Time: 09/21/20  8:51 PM  Result Value Ref Range   Sodium 136 135 - 145 mmol/L   Potassium 3.8 3.5 - 5.1 mmol/L   Chloride 102 98 - 111 mmol/L   CO2 26 22 - 32 mmol/L   Glucose, Bld 84 70 - 99 mg/dL    Comment: Glucose reference range applies only to samples taken after fasting for at least 8 hours.   BUN 19 6 - 20 mg/dL   Creatinine, Ser 09/23/20 (H) 0.61 - 1.24 mg/dL   Calcium 9.2 8.9 - 09/23/20 mg/dL   Total Protein 6.9 6.5 - 8.1 g/dL  Albumin 3.9 3.5 - 5.0 g/dL   AST 25 15 - 41 U/L   ALT 27 0 - 44 U/L   Alkaline Phosphatase 53 38 - 126 U/L   Total Bilirubin 0.8 0.3 - 1.2 mg/dL   GFR, Estimated >16>60 >10>60 mL/min    Comment: (NOTE) Calculated using the CKD-EPI Creatinine Equation (2021)    Anion gap 8 5 - 15    Comment: Performed at [facility], [address] [place], [place] [address]  CBC with Differential     Status: None   Collection Time: 09/21/20  8:51 PM  Result Value Ref Range   WBC 8.5 4.0 - 10.5 K/uL   RBC 5.02 4.22 - 5.81 MIL/uL   Hemoglobin 17.0 13.0 - 17.0 g/dL   HCT 40.948.3 81.139.0 - 91.452.0 %   MCV 96.2 80.0 - 100.0 fL   MCH 33.9 26.0 - 34.0 pg   MCHC 35.2 30.0 - 36.0 g/dL   RDW 78.212.3 95.611.5  - 21.315.5 %   Platelets 290 150 - 400 K/uL   nRBC 0.0 0.0 - 0.2 %   Neutrophils Relative % 68 %   Neutro Abs 5.7 1.7 - 7.7 K/uL   Lymphocytes Relative 20 %   Lymphs Abs 1.7 0.7 - 4.0 K/uL   Monocytes Relative 10 %   Monocytes Absolute 0.9 0.1 - 1.0 K/uL   Eosinophils Relative 2 %   Eosinophils Absolute 0.2 0.0 - 0.5 K/uL   Basophils Relative 0 %   Basophils Absolute 0.0 0.0 - 0.1 K/uL   Immature Granulocytes 0 %   Abs Immature Granulocytes 0.02 0.00 - 0.07 K/uL    Comment: Performed at [facility], [address] [place], [place] [address]  Resp Panel by RT-PCR (Flu A&B, Covid) Nasopharyngeal Swab     Status: None   Collection Time: 09/21/20  8:51 PM   Specimen: Nasopharyngeal Swab; Nasopharyngeal(NP) swabs in vial transport medium  Result Value Ref Range   SARS Coronavirus 2 by RT PCR NEGATIVE NEGATIVE    Comment: (NOTE) SARS-CoV-2 target nucleic acids are NOT DETECTED.  The SARS-CoV-2 RNA is generally detectable in upper respiratory specimens during the acute phase of infection. The lowest concentration of SARS-CoV-2 viral copies this assay can detect is 138 copies/mL. A negative result does not preclude SARS-Cov-2 infection and should not be used as the sole basis for treatment or other patient management decisions. A negative result may occur with  improper specimen collection/handling, submission of specimen other than nasopharyngeal swab, presence of viral mutation(s) within the areas targeted by this assay, and inadequate number of viral copies(<138 copies/mL). A negative result must be combined with clinical observations, patient history, and epidemiological information. The expected result is Negative.  Fact Sheet for Patients:  BloggerCourse.comhttps://www.fda.gov/media/152166/download  Fact Sheet for Healthcare Providers:  SeriousBroker.ithttps://www.fda.gov/media/152162/download  This test is no t yet approved or cleared by the [place] FDA and  has been authorized for  detection and/or diagnosis of SARS-CoV-2 by FDA under an Emergency Use Authorization (EUA). This EUA will remain  in effect (meaning this test can be used) for the duration of the COVID-19 declaration under Section 564(b)(1) of the Act, 21 U.S.C.section 360bbb-3(b)(1), unless the authorization is terminated  or revoked sooner.       Influenza A by PCR NEGATIVE NEGATIVE   Influenza B by PCR NEGATIVE NEGATIVE    Comment: (NOTE) The Xpert Xpress SARS-CoV-2/FLU/RSV plus assay is intended as an aid in the diagnosis of influenza from Nasopharyngeal  swab specimens and should not be used as a sole basis for treatment. Nasal washings and aspirates are unacceptable for Xpert Xpress SARS-CoV-2/FLU/RSV testing.  Fact Sheet for Patients: BloggerCourse.com  Fact Sheet for Healthcare Providers: SeriousBroker.it  This test is not yet approved or cleared by the [place] FDA and has been authorized for detection and/or diagnosis of SARS-CoV-2 by FDA under an Emergency Use Authorization (EUA). This EUA will remain in effect (meaning this test can be used) for the duration of the COVID-19 declaration under Section 564(b)(1) of the Act, 21 U.S.C. section 360bbb-3(b)(1), unless the authorization is terminated or revoked.  Performed at [facility], [address], [place], [place] [address]    MR LUMBAR SPINE WO CONTRAST  Result Date: 09/22/2020 CLINICAL DATA:  50 year old male with severe low back pain for several weeks. Pain radiating down the left leg. No new trauma. EXAM: MRI LUMBAR SPINE WITHOUT CONTRAST TECHNIQUE: Multiplanar, multisequence MR imaging of the lumbar spine was performed. No intravenous contrast was administered. COMPARISON:  None. FINDINGS: Segmentation: Lumbar segmentation appears to be normal and will be designated as such for this report. Alignment: Mild straightening of lumbar lordosis. No  spondylolisthesis. Vertebrae: Nonspecific decreased T1 marrow signal throughout the visible spine. Heterogeneity in the sacrum and visible pelvis. No convincing marrow edema on STIR imaging. No destructive osseous lesion identified. Mild lumbar spine endplate conchae cavity at multiple levels, most pronounced superiorly at L3 (series 6, image 10), but no associated marrow edema. Visible sacrum appears intact. Conus medullaris and cauda equina: Conus extends to the T12-L1 level. No lower spinal cord or conus signal abnormality. Cauda equina nerve roots appear normal. Paraspinal and other soft tissues: Negative. Disc levels: T12-L1:  Negative. L1-L2:  Disc desiccation and mild disc bulging.  No stenosis. L2-L3:  Negative. L3-L4: Disc desiccation and mild circumferential disc bulge. Mild facet hypertrophy. Borderline to mild bilateral L3 neural foraminal stenosis. L4-L5: Disc desiccation. Slightly caudal disc extrusion into the left lateral recess on series 5, image 12 and series 8, images 28 and 29. Extruded disc measures about 10 mm. Superimposed mild facet and ligament flavum hypertrophy. Severe stenosis at the descending left L5 nerve level. No significant spinal stenosis. Borderline to mild left L4 neural foraminal stenosis. L5-S1:  Disc desiccation but otherwise negative. IMPRESSION: 1. Symptomatic level appears to be L4-L5 where a 10 mm caudal disc extrusion into the left lateral recess results in severe lateral recess stenosis. Query left L5 radiculitis. 2. Underlying abnormal but nonspecific marrow signal throughout the visible spine and pelvis. A marrow infiltrative process is difficult to exclude, but this might also be seen with anemia, smoking, and obesity. Recommend hematologic lab analysis. Electronically Signed   By: Odessa Fleming M.D.   On: 09/22/2020 04:43    Review of Systems  Constitutional: Positive for activity change. Negative for appetite change, chills, diaphoresis, fatigue, fever and unexpected  weight change.  HENT: Negative.   Eyes: Negative.   Respiratory: Negative.   Cardiovascular: Negative.   Gastrointestinal: Positive for constipation.  Endocrine: Negative.   Genitourinary: Positive for difficulty urinating and frequency. Negative for dysuria, enuresis and flank pain.  Musculoskeletal: Positive for arthralgias, back pain, gait problem and myalgias. Negative for neck pain and neck stiffness.  Skin: Negative.   Allergic/Immunologic: Negative.   Neurological: Positive for weakness and numbness.  Hematological: Negative.   Psychiatric/Behavioral: Negative.     Blood pressure 140/89, pulse 72, temperature 98.5 F (36.9 C), resp. rate 20, SpO2 98 %. Physical  Exam Constitutional:      Appearance: He is normal weight.  HENT:     Head: Normocephalic and atraumatic.     Nose: Nose normal.     Mouth/Throat:     Mouth: Mucous membranes are moist.  Eyes:     Extraocular Movements: Extraocular movements intact.     Conjunctiva/sclera: Conjunctivae normal.     Pupils: Pupils are equal, round, and reactive to light.  Cardiovascular:     Rate and Rhythm: Normal rate and regular rhythm.     Pulses: Normal pulses.  Pulmonary:     Effort: Pulmonary effort is normal. No respiratory distress.  Abdominal:     General: Abdomen is flat.     Palpations: Abdomen is soft.  Musculoskeletal:     Cervical back: Normal range of motion and neck supple.  Neurological:     Mental Status: He is alert.     GCS: GCS eye subscore is 4. GCS verbal subscore is 5. GCS motor subscore is 6.     Cranial Nerves: Cranial nerves are intact.     Coordination: Coordination is intact.     Gait: Gait is intact.     Comments: Left hip flexor 4/5, Left knee extension 4/5 Decreased sensation left anterior thigh (L4 dermatome) Remainder of exam unremarkable      Assessment/Plan Patient with left L4-5 disc extrusion. He has been admitted with plan for left L4-5 discectomy today. Patient will hopefully be  able to discharge later this afternoon.  Floreen Comber, NP 09/22/2020, 9:45 AM

## 2020-09-22 NOTE — Anesthesia Procedure Notes (Signed)
Procedure Name: Intubation Date/Time: 09/22/2020 2:04 PM Performed by: Babs Bertin, CRNA Pre-anesthesia Checklist: Patient identified, Emergency Drugs available, Suction available and Patient being monitored Patient Re-evaluated:Patient Re-evaluated prior to induction Oxygen Delivery Method: Circle System Utilized Preoxygenation: Pre-oxygenation with 100% oxygen Induction Type: IV induction Ventilation: Mask ventilation without difficulty Laryngoscope Size: Mac and 4 Grade View: Grade I Tube type: Oral Tube size: 7.5 mm Number of attempts: 1 Airway Equipment and Method: Stylet and Oral airway Placement Confirmation: ETT inserted through vocal cords under direct vision,  positive ETCO2 and breath sounds checked- equal and bilateral Secured at: 23 cm Tube secured with: Tape Dental Injury: Teeth and Oropharynx as per pre-operative assessment

## 2020-09-22 NOTE — ED Notes (Signed)
Asked male at bedside to not lay in stretcher with pt as it is a safety issue.

## 2020-09-22 NOTE — ED Provider Notes (Signed)
  Patient transferred from [facility] for MRI Lumbar spine.   In brief, 50 y.o. M with lower back pain.  He states that this has been an ongoing issue and has been working to establish with neurosurgery.  He does tell me he has had an episode of urinary incontinence today.  He had previously had retention and was prescribed Flomax.  He reports he had 1 episode of urinary incontinence.  He also reports he has had some numbness/weakness of his left lower extremity.  Previous provider discussed with neurosurgery who recommended MRI.    Physical Exam  BP 126/86   Pulse 75   Temp 98 F (36.7 C)   Resp 18   SpO2 99%   Physical Exam  4/5 strength of LLE, 5/5 strength RLE Slightly decreased plantarflexion on LLE.   ED Course/Procedures     Procedures  MDM   MRI shows symptomatic level appears to be at the L4-L5 where 10 mm caudal disc extrusion to the left lateral recess resulting in severe lateral recess stenosis.  There is underlying abnormal but nonspecific marrow signal throughout the visible spine and pelvis.  Will consult neurosurgery.  Discussed patient with Doran Durand (Neurosurg). Will plan to admit the patient. Request pateint be kept NPO.   Updated patient on plan. He last ate some trail mix about 2 hours ago.  He is aware that he is to be n.p.o. and aware for plan for admission.  Portions of this note were generated with Scientist, clinical (histocompatibility and immunogenetics). Dictation errors may occur despite best attempts at proofreading.       Maxwell Caul, PA-C 09/22/20 [id number]    Shon Baton, MD 09/22/20 [id number]

## 2020-09-22 NOTE — Brief Op Note (Signed)
09/21/2020 - 09/22/2020  3:11 PM  PATIENT:  Belia Heman Ketelsen  50 y.o. male  PRE-OPERATIVE DIAGNOSIS:  HERNIATED DISC LUMBAR FOUR-FIVE  POST-OPERATIVE DIAGNOSIS:  HERNIATED DISC LUMBAR FOUR-FIVE  PROCEDURE:  Procedure(s): LEFT LUMBAR FOUR - LUMABR FIVE LAMINECTOMY WITH MICRODISCECTOMY (Left)  SURGEON:  Surgeon(s) and Role:    * Julio Sicks, MD - Primary  PHYSICIAN ASSISTANT:   ASSISTANTSMarland Mcalpine   ANESTHESIA:   general  EBL:  50 cc   BLOOD ADMINISTERED:none  DRAINS: none   LOCAL MEDICATIONS USED:  MARCAINE     SPECIMEN:  No Specimen  DISPOSITION OF SPECIMEN:  N/A  COUNTS:  YES  TOURNIQUET:  * No tourniquets in log *  DICTATION: .Dragon Dictation  PLAN OF CARE: Admit for overnight observation  PATIENT DISPOSITION:  PACU - hemodynamically stable.   Delay start of Pharmacological VTE agent (>24hrs) due to surgical blood loss or risk of bleeding: yes

## 2020-09-22 NOTE — Progress Notes (Signed)
Patient transported to OR at this time. Alert and in stable condition. Report given to receiving nurse Koleen Nimrod, RN with understanding verbalized and all questions answered. Left unit via bed with spouse at side.

## 2020-09-22 NOTE — Transfer of Care (Signed)
Immediate Anesthesia Transfer of Care Note  Patient: Zachary Romero  Procedure(s) Performed: LEFT LUMBAR FOUR - LUMABR FIVE LAMINECTOMY WITH MICRODISCECTOMY (Left Spine Lumbar)  Patient Location: PACU  Anesthesia Type:General  Level of Consciousness: awake, alert  and oriented  Airway & Oxygen Therapy: Patient Spontanous Breathing  Post-op Assessment: Report given to RN and Post -op Vital signs reviewed and stable  Post vital signs: Reviewed and stable  Last Vitals:  Vitals Value Taken Time  BP 133/81 09/22/20 1530  Temp    Pulse 88 09/22/20 1533  Resp 15 09/22/20 1533  SpO2 96 % 09/22/20 1533  Vitals shown include unvalidated device data.  Last Pain:  Vitals:   09/22/20 1222  TempSrc: Oral  PainSc:          Complications: No complications documented.

## 2020-09-22 NOTE — Op Note (Signed)
Date of procedure: 09/22/2020  Date of dictation: Same  Service: Neurosurgery  Preoperative diagnosis: Left L4-5 herniated nucleus pulposus with radiculopathy  Postoperative diagnosis: Same  Procedure Name: Left L4-5 laminotomy microdiscectomy  Surgeon:Brielle Moro A.Jiraiya Mcewan, M.D.  Asst. Surgeon: Doran Durand, NP  Anesthesia: General  Indication: 50 year old male with severe back and left lower extremity pain paresthesias and weakness consistent with a left-sided L5 radiculopathy which is failed conservative management work-up demonstrates evidence of a significant left-sided L4-5 disc herniation with marked compression of left L5 nerve root.  Patient presents now for laminotomy and microdiscectomy in hopes of improving his symptoms.  Operative note: After induction of anesthesia, patient positioned prone on the Wilson frame and appropriate padded.  Lumbar region prepped and draped sterilely.  Incision made overlying L4-5.  Dissection performed on the left.  Retractor placed.  X-ray taken.  Level confirmed.  Laminotomy performed using high-speed drill and Kerrison rongeurs.  Ligament flavum elevated and resected.  Underlying thecal sac and left L5 nerve root identified.  Epidural venous plexus coagulated and cut.  Microscope used for microdissection spinal canal.  Thecal sac and L5 nerve root gently mobilized and retracted medially.  A free fragment of disc herniation in continuity with the disc space was dissected free and removed in several large pieces.  The disc herniation tract in the disc space and the disc base was entered and all loose or obviously degenerative disc tear was moved in the interspace.  At this point a very thorough discectomy had been achieved.  There was no evidence of injury to thecal sac or nerve roots.  There is no evidence of any ongoing compression of thecal sac or nerve roots.  Wound is then irrigated.  Gelfoam was placed topically for hemostasis.  Wounds then closed in layers of  Vicryl sutures.  Steri-Strips and sterile dressing were applied.  No apparent complications.  Patient tolerated the procedure well and he returns to the recovery room postop.

## 2020-09-22 NOTE — Discharge Instructions (Signed)

## 2020-09-23 ENCOUNTER — Encounter (HOSPITAL_COMMUNITY): Payer: Self-pay | Admitting: Neurosurgery

## 2020-09-23 NOTE — Anesthesia Postprocedure Evaluation (Signed)
Anesthesia Post Note  Patient: Zachary Romero  Procedure(s) Performed: LEFT LUMBAR FOUR - LUMABR FIVE LAMINECTOMY WITH MICRODISCECTOMY (Left Spine Lumbar)     Patient location during evaluation: PACU Anesthesia Type: General Level of consciousness: awake and alert Pain management: pain level controlled Vital Signs Assessment: post-procedure vital signs reviewed and stable Respiratory status: spontaneous breathing, nonlabored ventilation, respiratory function stable and patient connected to nasal cannula oxygen Cardiovascular status: blood pressure returned to baseline and stable Postop Assessment: no apparent nausea or vomiting Anesthetic complications: no   No complications documented.  Last Vitals:  Vitals:   09/22/20 1616 09/22/20 1642  BP: 139/83 (!) 135/95  Pulse: 81 86  Resp: 18 18  Temp: 36.7 C 36.7 C  SpO2: 97% 99%    Last Pain:  Vitals:   09/22/20 1842  TempSrc:   PainSc: 5                  Kennieth Rad

## 2020-09-23 NOTE — Discharge Summary (Signed)
Physician Discharge Summary  Patient ID: Zachary Romero MRN: [id number] DOB/AGE: [birth year] 48 y.o.  Admit date: 09/21/2020 Discharge date: 09/23/2020  Admission Diagnoses:  Discharge Diagnoses:  Active Problems:   Herniated nucleus pulposus, L4-5 left   Discharged Condition: good  Hospital Course: Patient admitted to the hospital for treatment of a severe lumbar radiculopathy.  Work-up demonstrated a left-sided L4-5 disc herniation with marked compression of left L5 nerve root.  Patient was admitted to the hospital he underwent urgent left-sided L4-5 laminotomy and microdiscectomy postop he has done well.  Preoperative back and lower extreme pain much improved.  Standing ambulating voiding without difficulty.  Ready for discharge home.  Consults:   Significant Diagnostic Studies:   Treatments:   Discharge Exam: Blood pressure (!) 135/95, pulse 86, temperature 98 F (36.7 C), temperature source Oral, resp. rate 18, SpO2 99 %. Awake and alert.  Oriented and appropriate.  Motor and sensory function intact aside from some minimal dorsiflexion weakness.  Wound clean and dry.  Chest and abdomen benign.  Disposition: Discharge disposition: 01-Home or Self Care       Discharge Instructions    Discharge patient   Complete by: As directed    At 6 pm   Discharge disposition: 01-Home or Self Care   Discharge patient date: 09/22/2020     Allergies as of 09/22/2020   No Known Allergies     Medication List    TAKE these medications   cyclobenzaprine 10 MG tablet Commonly known as: FLEXERIL Take 1 tablet (10 mg total) by mouth 3 (three) times daily as needed for muscle spasms.   erythromycin ophthalmic ointment Place 1 application into the right eye once.   gabapentin 100 MG capsule Commonly known as: NEURONTIN Take 100 mg by mouth. For 2 days 1 capsule by mouth every 8 hours, then increase to 200mg  every 8 hours, may increase to 300mg  every 8 hours after 4 days    HYDROcodone-acetaminophen 5-325 MG tablet Commonly known as: NORCO/VICODIN Take 1 tablet by mouth every 6 (six) hours as needed.   ibuprofen 800 MG tablet Commonly known as: ADVIL Take 1 tablet (800 mg total) every 8 (eight) hours as needed by mouth for mild pain.   oxyCODONE 5 MG immediate release tablet Commonly known as: Oxy IR/ROXICODONE Take 1 tablet (5 mg total) by mouth every 4 (four) hours as needed for moderate pain.   predniSONE 10 MG (48) Tbpk tablet Commonly known as: STERAPRED UNI-PAK 48 TAB Take by mouth as directed. 48 Qty , 12 day taper   sildenafil 100 MG tablet Commonly known as: VIAGRA Take 100 mg by mouth daily as needed for erectile dysfunction.   tamsulosin 0.4 MG Caps capsule Commonly known as: FLOMAX Take 0.4 mg by mouth daily.       Follow-up Information    , MD. Schedule an appointment as soon as possible for a visit in 10 day(s).   Specialty: Neurosurgery Contact information: [address] [place] [address] [place] [phone]               Signed: [id number] 09/23/2020, 8:51 AM

## 2022-10-30 ENCOUNTER — Emergency Department (HOSPITAL_COMMUNITY)
Admission: EM | Admit: 2022-10-30 | Discharge: 2022-10-30 | Discharge status: Against Medical Advice | Payer: BLUE CROSS/BLUE SHIELD | Source: Home / Self Care

## 2022-11-01 ENCOUNTER — Emergency Department (HOSPITAL_COMMUNITY): Payer: Medicaid Other

## 2022-11-01 ENCOUNTER — Other Ambulatory Visit: Payer: Self-pay

## 2022-11-01 ENCOUNTER — Observation Stay (HOSPITAL_COMMUNITY): Payer: Medicaid Other | Admitting: Certified Registered Nurse Anesthetist

## 2022-11-01 ENCOUNTER — Inpatient Hospital Stay (HOSPITAL_COMMUNITY)
Admission: EM | Admit: 2022-11-01 | Discharge: 2022-11-04 | DRG: 580 | Disposition: A | Discharge status: Home / Self Care | Payer: Medicaid Other | Attending: Internal Medicine | Admitting: Internal Medicine

## 2022-11-01 ENCOUNTER — Observation Stay (HOSPITAL_BASED_OUTPATIENT_CLINIC_OR_DEPARTMENT_OTHER): Payer: Medicaid Other | Admitting: Certified Registered Nurse Anesthetist

## 2022-11-01 ENCOUNTER — Encounter (HOSPITAL_COMMUNITY)
Admission: EM | Disposition: A | Discharge status: Home / Self Care | Payer: Self-pay | Source: Home / Self Care | Attending: Internal Medicine

## 2022-11-01 ENCOUNTER — Encounter (HOSPITAL_COMMUNITY): Payer: Self-pay | Admitting: Emergency Medicine

## 2022-11-01 DIAGNOSIS — S61451A Open bite of right hand, initial encounter: Secondary | ICD-10-CM

## 2022-11-01 DIAGNOSIS — L089 Local infection of the skin and subcutaneous tissue, unspecified: Secondary | ICD-10-CM

## 2022-11-01 DIAGNOSIS — L03113 Cellulitis of right upper limb: Secondary | ICD-10-CM | POA: Diagnosis not present

## 2022-11-01 DIAGNOSIS — Z560 Unemployment, unspecified: Secondary | ICD-10-CM

## 2022-11-01 DIAGNOSIS — Z23 Encounter for immunization: Secondary | ICD-10-CM

## 2022-11-01 DIAGNOSIS — B954 Other streptococcus as the cause of diseases classified elsewhere: Secondary | ICD-10-CM | POA: Diagnosis present

## 2022-11-01 DIAGNOSIS — Z79899 Other long term (current) drug therapy: Secondary | ICD-10-CM

## 2022-11-01 DIAGNOSIS — F1010 Alcohol abuse, uncomplicated: Secondary | ICD-10-CM | POA: Diagnosis present

## 2022-11-01 DIAGNOSIS — L02511 Cutaneous abscess of right hand: Principal | ICD-10-CM | POA: Diagnosis present

## 2022-11-01 DIAGNOSIS — W503XXA Accidental bite by another person, initial encounter: Principal | ICD-10-CM

## 2022-11-01 DIAGNOSIS — S61411A Laceration without foreign body of right hand, initial encounter: Secondary | ICD-10-CM | POA: Diagnosis present

## 2022-11-01 HISTORY — DX: Local infection of the skin and subcutaneous tissue, unspecified: L08.9

## 2022-11-01 HISTORY — PX: I & D EXTREMITY: SHX5045

## 2022-11-01 LAB — CBC WITH DIFFERENTIAL/PLATELET
Abs Immature Granulocytes: 0.02 10*3/uL (ref 0.00–0.07)
Basophils Absolute: 0 10*3/uL (ref 0.0–0.1)
Basophils Relative: 0 %
Eosinophils Absolute: 0.1 10*3/uL (ref 0.0–0.5)
Eosinophils Relative: 1 %
HCT: 39.9 % (ref 39.0–52.0)
Hemoglobin: 13.9 g/dL (ref 13.0–17.0)
Immature Granulocytes: 0 %
Lymphocytes Relative: 13 %
Lymphs Abs: 1.2 10*3/uL (ref 0.7–4.0)
MCH: 33.4 pg (ref 26.0–34.0)
MCHC: 34.8 g/dL (ref 30.0–36.0)
MCV: 95.9 fL (ref 80.0–100.0)
Monocytes Absolute: 0.7 10*3/uL (ref 0.1–1.0)
Monocytes Relative: 8 %
Neutro Abs: 7.5 10*3/uL (ref 1.7–7.7)
Neutrophils Relative %: 78 %
Platelets: 274 10*3/uL (ref 150–400)
RBC: 4.16 MIL/uL — ABNORMAL LOW (ref 4.22–5.81)
RDW: 12.5 % (ref 11.5–15.5)
WBC: 9.6 10*3/uL (ref 4.0–10.5)
nRBC: 0 % (ref 0.0–0.2)

## 2022-11-01 LAB — COMPREHENSIVE METABOLIC PANEL
ALT: 25 U/L (ref 0–44)
AST: 24 U/L (ref 15–41)
Albumin: 3.6 g/dL (ref 3.5–5.0)
Alkaline Phosphatase: 67 U/L (ref 38–126)
Anion gap: 11 (ref 5–15)
BUN: 14 mg/dL (ref 6–20)
CO2: 21 mmol/L — ABNORMAL LOW (ref 22–32)
Calcium: 9 mg/dL (ref 8.9–10.3)
Chloride: 103 mmol/L (ref 98–111)
Creatinine, Ser: 1.16 mg/dL (ref 0.61–1.24)
GFR, Estimated: >60 mL/min (ref >60)
Glucose, Bld: 132 mg/dL — ABNORMAL HIGH (ref 70–99)
Potassium: 3.8 mmol/L (ref 3.5–5.1)
Sodium: 135 mmol/L (ref 135–145)
Total Bilirubin: 0.3 mg/dL (ref 0.3–1.2)
Total Protein: 7.5 g/dL (ref 6.5–8.1)

## 2022-11-01 LAB — C-REACTIVE PROTEIN: CRP: 8.3 mg/dL — ABNORMAL HIGH (ref <1.0)

## 2022-11-01 LAB — LACTIC ACID, PLASMA
Lactic Acid, Venous: 1.1 mmol/L (ref 0.5–1.9)
Lactic Acid, Venous: 1.2 mmol/L (ref 0.5–1.9)

## 2022-11-01 LAB — PROTIME-INR
INR: 1 (ref 0.8–1.2)
Prothrombin Time: 13.6 seconds (ref 11.4–15.2)

## 2022-11-01 LAB — HIV ANTIBODY (ROUTINE TESTING W REFLEX): HIV Screen 4th Generation wRfx: NONREACTIVE

## 2022-11-01 SURGERY — IRRIGATION AND DEBRIDEMENT EXTREMITY
Anesthesia: General | Site: Hand | Laterality: Right

## 2022-11-01 MED ORDER — MIDAZOLAM HCL 2 MG/2ML IJ SOLN
INTRAMUSCULAR | Status: AC
  Filled 2022-11-01: qty 2

## 2022-11-01 MED ORDER — HYDROMORPHONE HCL 1 MG/ML IJ SOLN
1.0000 mg | INTRAMUSCULAR | Status: DC | PRN
  Administered 2022-11-01 – 2022-11-02 (×3): 1 mg via INTRAVENOUS
  Filled 2022-11-01 (×3): qty 1

## 2022-11-01 MED ORDER — SUGAMMADEX SODIUM 200 MG/2ML IV SOLN
INTRAVENOUS | Status: DC | PRN
  Administered 2022-11-01: 200 mg via INTRAVENOUS

## 2022-11-01 MED ORDER — FENTANYL CITRATE (PF) 100 MCG/2ML IJ SOLN
INTRAMUSCULAR | Status: DC | PRN
  Administered 2022-11-01: 50 ug via INTRAVENOUS
  Administered 2022-11-01: 100 ug via INTRAVENOUS

## 2022-11-01 MED ORDER — THIAMINE HCL 100 MG/ML IJ SOLN
100.0000 mg | Freq: Every day | INTRAMUSCULAR | Status: DC
  Filled 2022-11-01: qty 2

## 2022-11-01 MED ORDER — THIAMINE MONONITRATE 100 MG PO TABS
100.0000 mg | ORAL_TABLET | Freq: Every day | ORAL | Status: DC
  Administered 2022-11-01 – 2022-11-04 (×4): 100 mg via ORAL
  Filled 2022-11-01 (×4): qty 1

## 2022-11-01 MED ORDER — ACETAMINOPHEN 10 MG/ML IV SOLN: 1000.0000 mg | Freq: Once | INTRAVENOUS | Status: DC | PRN

## 2022-11-01 MED ORDER — SODIUM CHLORIDE 0.9 % IV SOLN
3.0000 g | Freq: Three times a day (TID) | INTRAVENOUS | Status: DC
  Administered 2022-11-01 – 2022-11-04 (×9): 3 g via INTRAVENOUS
  Filled 2022-11-01 (×9): qty 8

## 2022-11-01 MED ORDER — ONDANSETRON HCL 4 MG/2ML IJ SOLN
4.0000 mg | Freq: Once | INTRAMUSCULAR | Status: AC
  Administered 2022-11-01: 4 mg via INTRAVENOUS
  Filled 2022-11-01: qty 2

## 2022-11-01 MED ORDER — MIDAZOLAM HCL 5 MG/5ML IJ SOLN
INTRAMUSCULAR | Status: DC | PRN
  Administered 2022-11-01: 2 mg via INTRAVENOUS

## 2022-11-01 MED ORDER — ONDANSETRON HCL 4 MG/2ML IJ SOLN
INTRAMUSCULAR | Status: AC
  Filled 2022-11-01: qty 2

## 2022-11-01 MED ORDER — ROCURONIUM BROMIDE 100 MG/10ML IV SOLN
INTRAVENOUS | Status: DC | PRN
  Administered 2022-11-01: 60 mg via INTRAVENOUS

## 2022-11-01 MED ORDER — PHENYLEPHRINE HCL (PRESSORS) 10 MG/ML IV SOLN
INTRAVENOUS | Status: DC | PRN
  Administered 2022-11-01: 80 ug via INTRAVENOUS

## 2022-11-01 MED ORDER — ACETAMINOPHEN 325 MG PO TABS: 325.0000 mg | ORAL_TABLET | Freq: Once | ORAL | Status: DC | PRN

## 2022-11-01 MED ORDER — 0.9 % SODIUM CHLORIDE (POUR BTL) OPTIME
TOPICAL | Status: DC | PRN
  Administered 2022-11-01: 1000 mL

## 2022-11-01 MED ORDER — LIDOCAINE 2% (20 MG/ML) 5 ML SYRINGE
INTRAMUSCULAR | Status: AC
  Filled 2022-11-01: qty 5

## 2022-11-01 MED ORDER — AMISULPRIDE (ANTIEMETIC) 5 MG/2ML IV SOLN: 10.0000 mg | Freq: Once | INTRAVENOUS | Status: DC | PRN

## 2022-11-01 MED ORDER — ACETAMINOPHEN 650 MG RE SUPP: 650.0000 mg | Freq: Four times a day (QID) | RECTAL | Status: DC | PRN

## 2022-11-01 MED ORDER — HYDROCODONE-ACETAMINOPHEN 7.5-325 MG PO TABS
1.0000 | ORAL_TABLET | ORAL | Status: DC | PRN
  Administered 2022-11-02 – 2022-11-03 (×3): 2 via ORAL
  Filled 2022-11-01 (×3): qty 2

## 2022-11-01 MED ORDER — PROPOFOL 10 MG/ML IV BOLUS
INTRAVENOUS | Status: DC | PRN
  Administered 2022-11-01: 150 mg via INTRAVENOUS

## 2022-11-01 MED ORDER — PROMETHAZINE HCL 25 MG/ML IJ SOLN: 6.2500 mg | INTRAMUSCULAR | Status: DC | PRN

## 2022-11-01 MED ORDER — SODIUM CHLORIDE 0.9 % IV BOLUS
1000.0000 mL | Freq: Once | INTRAVENOUS | Status: AC
  Administered 2022-11-01: 1000 mL via INTRAVENOUS

## 2022-11-01 MED ORDER — LIDOCAINE HCL (CARDIAC) PF 100 MG/5ML IV SOSY
PREFILLED_SYRINGE | INTRAVENOUS | Status: DC | PRN
  Administered 2022-11-01: 40 mg via INTRAVENOUS

## 2022-11-01 MED ORDER — SODIUM CHLORIDE 0.9% FLUSH
3.0000 mL | Freq: Two times a day (BID) | INTRAVENOUS | Status: DC
  Administered 2022-11-01 – 2022-11-04 (×6): 3 mL via INTRAVENOUS

## 2022-11-01 MED ORDER — SODIUM CHLORIDE 0.9 % IV SOLN
3.0000 g | Freq: Once | INTRAVENOUS | Status: AC
  Administered 2022-11-01: 3 g via INTRAVENOUS
  Filled 2022-11-01: qty 8

## 2022-11-01 MED ORDER — ACETAMINOPHEN 325 MG PO TABS: 650.0000 mg | ORAL_TABLET | Freq: Four times a day (QID) | ORAL | Status: DC | PRN

## 2022-11-01 MED ORDER — MORPHINE SULFATE (PF) 2 MG/ML IV SOLN: 2.0000 mg | INTRAVENOUS | Status: DC | PRN

## 2022-11-01 MED ORDER — FENTANYL CITRATE (PF) 250 MCG/5ML IJ SOLN
INTRAMUSCULAR | Status: AC
  Filled 2022-11-01: qty 5

## 2022-11-01 MED ORDER — PROPOFOL 10 MG/ML IV BOLUS
INTRAVENOUS | Status: AC
  Filled 2022-11-01: qty 20

## 2022-11-01 MED ORDER — MEPERIDINE HCL 25 MG/ML IJ SOLN: 6.2500 mg | INTRAMUSCULAR | Status: DC | PRN

## 2022-11-01 MED ORDER — ACETAMINOPHEN 10 MG/ML IV SOLN
INTRAVENOUS | Status: DC | PRN
  Administered 2022-11-01: 1000 mg via INTRAVENOUS

## 2022-11-01 MED ORDER — MORPHINE SULFATE (PF) 4 MG/ML IV SOLN
4.0000 mg | Freq: Once | INTRAVENOUS | Status: AC
  Administered 2022-11-01: 4 mg via INTRAVENOUS
  Filled 2022-11-01: qty 1

## 2022-11-01 MED ORDER — ADULT MULTIVITAMIN W/MINERALS CH
1.0000 | ORAL_TABLET | Freq: Every day | ORAL | Status: DC
  Administered 2022-11-01 – 2022-11-04 (×4): 1 via ORAL
  Filled 2022-11-01 (×4): qty 1

## 2022-11-01 MED ORDER — SUCCINYLCHOLINE CHLORIDE 200 MG/10ML IV SOSY
PREFILLED_SYRINGE | INTRAVENOUS | Status: AC
  Filled 2022-11-01: qty 10

## 2022-11-01 MED ORDER — ONDANSETRON HCL 4 MG/2ML IJ SOLN: 4.0000 mg | Freq: Four times a day (QID) | INTRAMUSCULAR | Status: DC | PRN

## 2022-11-01 MED ORDER — ACETAMINOPHEN 160 MG/5ML PO SOLN: 325.0000 mg | Freq: Once | ORAL | Status: DC | PRN

## 2022-11-01 MED ORDER — DEXMEDETOMIDINE HCL IN NACL 80 MCG/20ML IV SOLN
INTRAVENOUS | Status: DC | PRN
  Administered 2022-11-01: 16 ug via INTRAVENOUS

## 2022-11-01 MED ORDER — FOLIC ACID 1 MG PO TABS
1.0000 mg | ORAL_TABLET | Freq: Every day | ORAL | Status: DC
  Administered 2022-11-01 – 2022-11-04 (×4): 1 mg via ORAL
  Filled 2022-11-01 (×4): qty 1

## 2022-11-01 MED ORDER — TETANUS-DIPHTH-ACELL PERTUSSIS 5-2.5-18.5 LF-MCG/0.5 IM SUSY
0.5000 mL | PREFILLED_SYRINGE | Freq: Once | INTRAMUSCULAR | Status: AC
  Administered 2022-11-01: 0.5 mL via INTRAMUSCULAR
  Filled 2022-11-01: qty 0.5

## 2022-11-01 MED ORDER — HYDROMORPHONE HCL 1 MG/ML IJ SOLN
INTRAMUSCULAR | Status: AC
  Filled 2022-11-01: qty 1

## 2022-11-01 MED ORDER — OXYCODONE HCL 5 MG PO TABS: 5.0000 mg | ORAL_TABLET | Freq: Once | ORAL | Status: DC | PRN

## 2022-11-01 MED ORDER — ONDANSETRON HCL 4 MG/2ML IJ SOLN
INTRAMUSCULAR | Status: DC | PRN
  Administered 2022-11-01: 4 mg via INTRAVENOUS

## 2022-11-01 MED ORDER — ACETAMINOPHEN 10 MG/ML IV SOLN
INTRAVENOUS | Status: AC
  Filled 2022-11-01: qty 100

## 2022-11-01 MED ORDER — HYDROCODONE-ACETAMINOPHEN 5-325 MG PO TABS
1.0000 | ORAL_TABLET | ORAL | Status: DC | PRN
  Administered 2022-11-02: 1 via ORAL
  Filled 2022-11-01: qty 1
  Filled 2022-11-01: qty 2

## 2022-11-01 MED ORDER — LACTATED RINGERS IV SOLN: INTRAVENOUS | Status: DC

## 2022-11-01 MED ORDER — LORAZEPAM 0.5 MG PO TABS: 0.5000 mg | ORAL_TABLET | ORAL | Status: DC | PRN

## 2022-11-01 MED ORDER — LORAZEPAM 1 MG PO TABS: 1.0000 mg | ORAL_TABLET | ORAL | Status: DC | PRN

## 2022-11-01 MED ORDER — BUPIVACAINE HCL (PF) 0.25 % IJ SOLN
INTRAMUSCULAR | Status: DC | PRN
  Administered 2022-11-01: 10 mL

## 2022-11-01 MED ORDER — ONDANSETRON HCL 4 MG PO TABS: 4.0000 mg | ORAL_TABLET | Freq: Four times a day (QID) | ORAL | Status: DC | PRN

## 2022-11-01 MED ORDER — HYDROMORPHONE HCL 1 MG/ML IJ SOLN
0.2500 mg | INTRAMUSCULAR | Status: DC | PRN
  Administered 2022-11-01: 0.5 mg via INTRAVENOUS

## 2022-11-01 MED ORDER — OXYCODONE HCL 5 MG/5ML PO SOLN: 5.0000 mg | Freq: Once | ORAL | Status: DC | PRN

## 2022-11-01 MED ORDER — SODIUM CHLORIDE 0.9 % IR SOLN
Status: DC | PRN
  Administered 2022-11-01: 3000 mL

## 2022-11-01 MED ORDER — LACTATED RINGERS IV SOLN: INTRAVENOUS | Status: DC | PRN

## 2022-11-01 MED ORDER — NAPROXEN 250 MG PO TABS
250.0000 mg | ORAL_TABLET | Freq: Two times a day (BID) | ORAL | Status: DC
  Administered 2022-11-02 – 2022-11-04 (×5): 250 mg via ORAL
  Filled 2022-11-01 (×5): qty 1

## 2022-11-01 SURGICAL SUPPLY — 67 items
ADAPTER CATH SYR TO TUBING 38M (ADAPTER) ×1 IMPLANT
ADPR CATH LL SYR 3/32 TPR (ADAPTER)
BAG COUNTER SPONGE SURGICOUNT (BAG) ×1 IMPLANT
BAG SPNG CNTER NS LX DISP (BAG) ×1
BNDG CMPR 5X2 CHSV 1 LYR STRL (GAUZE/BANDAGES/DRESSINGS)
BNDG CMPR 5X3 KNIT ELC UNQ LF (GAUZE/BANDAGES/DRESSINGS)
BNDG CMPR 9X4 STRL LF SNTH (GAUZE/BANDAGES/DRESSINGS) ×1
BNDG CMPR STD VLCR NS LF 5.8X3 (GAUZE/BANDAGES/DRESSINGS) ×1
BNDG CMPR STD VLCR NS LF 5.8X4 (GAUZE/BANDAGES/DRESSINGS) ×1
BNDG COHESIVE 2X5 TAN ST LF (GAUZE/BANDAGES/DRESSINGS) IMPLANT
BNDG ELASTIC 3INX 5YD STR LF (GAUZE/BANDAGES/DRESSINGS) ×1 IMPLANT
BNDG ELASTIC 3X5.8 VLCR NS LF (GAUZE/BANDAGES/DRESSINGS) IMPLANT
BNDG ELASTIC 4X5.8 VLCR NS LF (GAUZE/BANDAGES/DRESSINGS) IMPLANT
BNDG ELASTIC 4X5.8 VLCR STR LF (GAUZE/BANDAGES/DRESSINGS) ×1 IMPLANT
BNDG ESMARK 4X9 LF (GAUZE/BANDAGES/DRESSINGS) IMPLANT
BNDG GAUZE DERMACEA FLUFF 4 (GAUZE/BANDAGES/DRESSINGS) ×1 IMPLANT
BNDG GZE DERMACEA 4 6PLY (GAUZE/BANDAGES/DRESSINGS) ×1
CANNULA VESSEL 3MM 2 BLNT TIP (CANNULA) IMPLANT
CORD BIPOLAR FORCEPS 12FT (ELECTRODE) ×1 IMPLANT
COVER SURGICAL LIGHT HANDLE (MISCELLANEOUS) ×1 IMPLANT
CUFF TOURN SGL QUICK 18X4 (TOURNIQUET CUFF) IMPLANT
CUFF TOURN SGL QUICK 24 (TOURNIQUET CUFF)
CUFF TRNQT CYL 24X4X16.5-23 (TOURNIQUET CUFF) IMPLANT
DRAIN PENROSE 12X.25 LTX STRL (MISCELLANEOUS) IMPLANT
GAUZE PACKING IODOFORM 1/4X15 (PACKING) IMPLANT
GAUZE PAD ABD 8X10 STRL (GAUZE/BANDAGES/DRESSINGS) ×2 IMPLANT
GAUZE SPONGE 4X4 12PLY STRL (GAUZE/BANDAGES/DRESSINGS) ×1 IMPLANT
GAUZE XEROFORM 1X8 LF (GAUZE/BANDAGES/DRESSINGS) ×1 IMPLANT
GLOVE BIO SURGEON STRL SZ7.5 (GLOVE) ×1 IMPLANT
GLOVE BIOGEL PI IND STRL 8 (GLOVE) ×1 IMPLANT
GLOVE BIOGEL PI IND STRL 8.5 (GLOVE) IMPLANT
GLOVE PI ORTHO PRO STRL SZ8 (GLOVE) IMPLANT
GLOVE SURG ORTHO 8.0 STRL STRW (GLOVE) IMPLANT
GOWN STRL REUS W/ TWL LRG LVL3 (GOWN DISPOSABLE) ×1 IMPLANT
GOWN STRL REUS W/ TWL XL LVL3 (GOWN DISPOSABLE) ×1 IMPLANT
GOWN STRL REUS W/TWL LRG LVL3 (GOWN DISPOSABLE) ×1
GOWN STRL REUS W/TWL XL LVL3 (GOWN DISPOSABLE) ×1
KIT BASIN OR (CUSTOM PROCEDURE TRAY) ×1 IMPLANT
KIT TURNOVER KIT B (KITS) ×1 IMPLANT
LOOP VASCLR MAXI BLUE 18IN ST (MISCELLANEOUS) IMPLANT
LOOP VASCULAR MAXI 18 BLUE (MISCELLANEOUS)
LOOPS VASCLR MAXI BLUE 18IN ST (MISCELLANEOUS) IMPLANT
MANIFOLD NEPTUNE II (INSTRUMENTS) IMPLANT
NDL HYPO 25X1 1.5 SAFETY (NEEDLE) IMPLANT
NEEDLE HYPO 25X1 1.5 SAFETY (NEEDLE) ×1 IMPLANT
NS IRRIG 1000ML POUR BTL (IV SOLUTION) ×1 IMPLANT
PACK ORTHO EXTREMITY (CUSTOM PROCEDURE TRAY) ×1 IMPLANT
PAD ARMBOARD 7.5X6 YLW CONV (MISCELLANEOUS) ×2 IMPLANT
PADDING CAST ABS COTTON 4X4 ST (CAST SUPPLIES) IMPLANT
SET CYSTO W/LG BORE CLAMP LF (SET/KITS/TRAYS/PACK) IMPLANT
SOL PREP POV-IOD 4OZ 10% (MISCELLANEOUS) ×2 IMPLANT
SPIKE FLUID TRANSFER (MISCELLANEOUS) ×1 IMPLANT
SPLINT PLASTER CAST FAST 3X15 (CAST SUPPLIES) IMPLANT
SPONGE T-LAP 4X18 ~~LOC~~+RFID (SPONGE) ×1 IMPLANT
SUT ETHILON 4 0 P 3 18 (SUTURE) IMPLANT
SUT ETHILON 4 0 PS 2 18 (SUTURE) IMPLANT
SUT MON AB 5-0 P3 18 (SUTURE) IMPLANT
SWAB COLLECTION DEVICE MRSA (MISCELLANEOUS) IMPLANT
SWAB CULTURE ESWAB REG 1ML (MISCELLANEOUS) IMPLANT
SYR 20ML LL LF (SYRINGE) ×1 IMPLANT
SYR CONTROL 10ML LL (SYRINGE) IMPLANT
TOWEL GREEN STERILE (TOWEL DISPOSABLE) ×1 IMPLANT
TUBE CONNECTING 12X1/4 (SUCTIONS) ×1 IMPLANT
TUBE NG 5FR 35IN ENFIT (TUBING) IMPLANT
UNDERPAD 30X36 HEAVY ABSORB (UNDERPADS AND DIAPERS) ×1 IMPLANT
VASCULAR TIE MAXI BLUE 18IN ST (MISCELLANEOUS)
YANKAUER SUCT BULB TIP NO VENT (SUCTIONS) ×1 IMPLANT
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
## 2022-11-01 NOTE — Progress Notes (Signed)
Postop note: Status post incision and drainage right hand including MP joint long finger.  Gross purulence encountered.  Cultures taken.  Continue IV antibiotics.  Start hydrotherapy in 2 to 4 days.  Okay for discharge when white blood count normalizing, afebrile, pain controlled.  Can start hydrotherapy in office after discharge.  Recommend Augmentin on discharge.  Okay for DVT prophylaxis.
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
## 2022-11-01 NOTE — Anesthesia Preprocedure Evaluation (Signed)
Anesthesia Evaluation  Patient identified by MRN, date of birth, ID band Patient awake    Reviewed: Allergy & Precautions, NPO status , Patient's Chart, lab work & pertinent test results  Airway Mallampati: I  TM Distance: >3 FB Neck ROM: Full    Dental  (+) Teeth Intact, Dental Advisory Given   Pulmonary neg pulmonary ROS   breath sounds clear to auscultation       Cardiovascular negative cardio ROS  Rhythm:Regular Rate:Normal     Neuro/Psych negative neurological ROS     GI/Hepatic negative GI ROS, Neg liver ROS,,,  Endo/Other  negative endocrine ROS    Renal/GU      Musculoskeletal negative musculoskeletal ROS (+)    Abdominal   Peds  Hematology   Anesthesia Other Findings   Reproductive/Obstetrics                             Anesthesia Physical Anesthesia Plan  ASA: 2  Anesthesia Plan: General   Post-op Pain Management: Tylenol PO (pre-op)* and Toradol IV (intra-op)*   Induction: Intravenous  PONV Risk Score and Plan: 3 and Ondansetron, Dexamethasone and Midazolam  Airway Management Planned: Oral ETT  Additional Equipment: None  Intra-op Plan:   Post-operative Plan: Extubation in OR  Informed Consent: I have reviewed the patients History and Physical, chart, labs and discussed the procedure including the risks, benefits and alternatives for the proposed anesthesia with the patient or authorized representative who has indicated his/her understanding and acceptance.     Dental advisory given  Plan Discussed with: CRNA  Anesthesia Plan Comments:        Anesthesia Quick Evaluation

## 2022-11-01 NOTE — Plan of Care (Signed)
Problem: Education: Goal: Knowledge of General Education information will improve Description: Including pain rating scale, medication(s)/side effects and non-pharmacologic comfort measures Outcome: Progressing Pt is aware he has an admitting dx of right hand infection and will be on abx per MD's orders.  D/T his hx of substance abuse/ ETOH pt is aware he is on CIWA protocol.    Problem: Clinical Measurements: Goal: Will remain free from infection Outcome: Progressing S/Sx of infection monitor q-shift.  Pt has remained afebrile thus far.  He is on IV abx per MD's orders.     Problem: Clinical Measurements: Goal: Respiratory complications will improve Outcome: Progressing Respiratory status monitored and assessed q-shift.  Pt is on room air with O2 saturations at 100% and respiration rate of 18 breaths per minute.  Pt has denied c/o SOB and DOE.    Problem: Activity: Goal: Risk for activity intolerance will decrease Outcome: Progressing Pt is independent of all ADL.  He can ambulate freely in his room without the assistance of RN staff.    Problem: Coping: Goal: Level of anxiety will decrease Outcome: Progressing Pt has not endorsed c/o anxiety thus far.    Problem: Nutrition: Goal: Adequate nutrition will be maintained Outcome: Progressing Pt is NPO per MD's orders and is awaiting a I/D of his right hand   Problem: Elimination: Goal: Will not experience complications related to bowel motility Outcome: Progressing Pt has not endorsed c/o of constipation of bowel incontinence.     Problem: Elimination: Goal: Will not experience complications related to urinary retention Outcome: Progressing Pt has not endorsed c/o of urinary retention or incontinence.  He does not have a distend abdomen.            Problem: Safety: Goal: Ability to remain free from injury will improve Outcome: Progressing Pt has remained safe from falls thus far.  Instructed pt to utilize RN call light for  assistance.  Hourly rounds performed.  Bed in lowest position, locked with two upper side rails engaged.  Belongings and call light within reach.    Problem: Skin Integrity: Goal: Risk for impaired skin integrity will decrease Outcome: Progressing Skin integrity monitored and assessed q-shift.  Pt has an admitting dx of right hand infection and will be on abx per MD's orders.  Instructed pt to turn and reposition himself q2 hours to prevent further skin impairment.  Tubes and drains assessed for device related pressure sores.  Pt is continent of both his bowel and bladder.  Dressing changes performed per MD's orders.
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
## 2022-11-01 NOTE — Transfer of Care (Signed)
Immediate Anesthesia Transfer of Care Note  Patient: Zachary Romero  Procedure(s) Performed: IRRIGATION AND DEBRIDEMENT OF HAND (Right: Hand)  Patient Location: PACU  Anesthesia Type:General  Level of Consciousness: awake, alert , and oriented  Airway & Oxygen Therapy: Patient Spontanous Breathing and Patient connected to nasal cannula oxygen  Post-op Assessment: Report given to RN, Post -op Vital signs reviewed and stable, and Patient moving all extremities  Post vital signs: Reviewed and stable  Last Vitals:  Vitals Value Taken Time  BP 153/89 11/01/22 2054  Temp    Pulse 69 11/01/22 2059  Resp 0 11/01/22 2059  SpO2 100 % 11/01/22 2059  Vitals shown include unvalidated device data.  Last Pain:  Vitals:   11/01/22 1604  TempSrc: Oral  PainSc:       Patients Stated Pain Goal: 3 (11/01/22 1243)  Complications: No notable events documented.

## 2022-11-01 NOTE — Progress Notes (Signed)
Pt admitted from the ED with admitting dx of right hand infection.  He was transported via gurney accompanied by 1 transporter.  Oriented pt to his new surroundings and reviewed POC.  Answered any pending questions pt had.  No further questions at this moment.  Obtained VS which were WNL.  Instructed pt to utilize RN call light for assistance.  Bed in lowest position, locked with two upper side rails engaged.  Belongings and call light within reach.

## 2022-11-01 NOTE — Progress Notes (Signed)
Attempted to call s/o to let her know that he was returned to his room but the number stated it was not a working number.

## 2022-11-01 NOTE — Discharge Instructions (Signed)

## 2022-11-01 NOTE — Anesthesia Procedure Notes (Signed)
Procedure Name: Intubation Date/Time: 11/01/2022 7:57 PM  Performed by: Zuleyka Kloc T, CRNAPre-anesthesia Checklist: Patient identified, Emergency Drugs available, Suction available and Patient being monitored Patient Re-evaluated:Patient Re-evaluated prior to induction Oxygen Delivery Method: Circle system utilized Preoxygenation: Pre-oxygenation with 100% oxygen Induction Type: IV induction Ventilation: Mask ventilation without difficulty Laryngoscope Size: Miller and 3 Grade View: Grade I Tube type: Oral Tube size: 7.5 mm Number of attempts: 1 Airway Equipment and Method: Stylet and Oral airway Placement Confirmation: ETT inserted through vocal cords under direct vision, positive ETCO2 and breath sounds checked- equal and bilateral Secured at: 23 cm Tube secured with: Tape Dental Injury: Teeth and Oropharynx as per pre-operative assessment

## 2022-11-02 ENCOUNTER — Encounter (HOSPITAL_COMMUNITY): Payer: Self-pay | Admitting: Orthopedic Surgery

## 2022-11-02 DIAGNOSIS — B954 Other streptococcus as the cause of diseases classified elsewhere: Secondary | ICD-10-CM | POA: Diagnosis not present

## 2022-11-02 DIAGNOSIS — S61411A Laceration without foreign body of right hand, initial encounter: Secondary | ICD-10-CM | POA: Diagnosis not present

## 2022-11-02 DIAGNOSIS — S61451A Open bite of right hand, initial encounter: Secondary | ICD-10-CM | POA: Diagnosis not present

## 2022-11-02 DIAGNOSIS — Z560 Unemployment, unspecified: Secondary | ICD-10-CM | POA: Diagnosis not present

## 2022-11-02 DIAGNOSIS — L089 Local infection of the skin and subcutaneous tissue, unspecified: Secondary | ICD-10-CM | POA: Diagnosis not present

## 2022-11-02 DIAGNOSIS — L02511 Cutaneous abscess of right hand: Secondary | ICD-10-CM | POA: Diagnosis not present

## 2022-11-02 DIAGNOSIS — Z79899 Other long term (current) drug therapy: Secondary | ICD-10-CM | POA: Diagnosis not present

## 2022-11-02 DIAGNOSIS — L03113 Cellulitis of right upper limb: Secondary | ICD-10-CM | POA: Diagnosis not present

## 2022-11-02 DIAGNOSIS — F1010 Alcohol abuse, uncomplicated: Secondary | ICD-10-CM | POA: Diagnosis not present

## 2022-11-02 DIAGNOSIS — M79641 Pain in right hand: Secondary | ICD-10-CM | POA: Diagnosis not present

## 2022-11-02 DIAGNOSIS — Z23 Encounter for immunization: Secondary | ICD-10-CM | POA: Diagnosis not present

## 2022-11-02 HISTORY — DX: Cellulitis of right upper limb: L03.113

## 2022-11-02 LAB — AEROBIC/ANAEROBIC CULTURE W GRAM STAIN (SURGICAL/DEEP WOUND)

## 2022-11-02 LAB — COMPREHENSIVE METABOLIC PANEL
ALT: 20 U/L (ref 0–44)
AST: 18 U/L (ref 15–41)
Albumin: 3 g/dL — ABNORMAL LOW (ref 3.5–5.0)
Alkaline Phosphatase: 64 U/L (ref 38–126)
Anion gap: 7 (ref 5–15)
BUN: 10 mg/dL (ref 6–20)
CO2: 24 mmol/L (ref 22–32)
Calcium: 8.5 mg/dL — ABNORMAL LOW (ref 8.9–10.3)
Chloride: 106 mmol/L (ref 98–111)
Creatinine, Ser: 0.72 mg/dL (ref 0.61–1.24)
GFR, Estimated: >60 mL/min (ref >60)
Glucose, Bld: 156 mg/dL — ABNORMAL HIGH (ref 70–99)
Potassium: 3.3 mmol/L — ABNORMAL LOW (ref 3.5–5.1)
Sodium: 137 mmol/L (ref 135–145)
Total Bilirubin: 0.2 mg/dL — ABNORMAL LOW (ref 0.3–1.2)
Total Protein: 6.5 g/dL (ref 6.5–8.1)

## 2022-11-02 LAB — CBC
HCT: 37.6 % — ABNORMAL LOW (ref 39.0–52.0)
Hemoglobin: 13 g/dL (ref 13.0–17.0)
MCH: 32.8 pg (ref 26.0–34.0)
MCHC: 34.6 g/dL (ref 30.0–36.0)
MCV: 94.9 fL (ref 80.0–100.0)
Platelets: 245 10*3/uL (ref 150–400)
RBC: 3.96 MIL/uL — ABNORMAL LOW (ref 4.22–5.81)
RDW: 12.3 % (ref 11.5–15.5)
WBC: 7.8 10*3/uL (ref 4.0–10.5)
nRBC: 0 % (ref 0.0–0.2)

## 2022-11-02 MED ORDER — HYDROMORPHONE HCL 1 MG/ML IJ SOLN
1.0000 mg | Freq: Four times a day (QID) | INTRAMUSCULAR | Status: DC | PRN
  Administered 2022-11-02 – 2022-11-03 (×2): 1 mg via INTRAVENOUS
  Filled 2022-11-02 (×2): qty 1

## 2022-11-02 MED ORDER — ORAL CARE MOUTH RINSE: 15.0000 mL | OROMUCOSAL | Status: DC | PRN

## 2022-11-02 MED ORDER — LORAZEPAM 1 MG PO TABS: 1.0000 mg | ORAL_TABLET | ORAL | Status: DC | PRN

## 2022-11-02 MED ORDER — POTASSIUM CHLORIDE CRYS ER 20 MEQ PO TBCR
40.0000 meq | EXTENDED_RELEASE_TABLET | Freq: Two times a day (BID) | ORAL | Status: AC
  Administered 2022-11-02 (×2): 40 meq via ORAL
  Filled 2022-11-02 (×2): qty 2

## 2022-11-02 MED ORDER — HALOPERIDOL LACTATE 5 MG/ML IJ SOLN: 5.0000 mg | Freq: Four times a day (QID) | INTRAMUSCULAR | Status: DC | PRN
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
## 2022-11-02 NOTE — Anesthesia Postprocedure Evaluation (Signed)
Anesthesia Post Note  Patient: Zachary Romero  Procedure(s) Performed: IRRIGATION AND DEBRIDEMENT OF HAND (Right: Hand)     Patient location during evaluation: PACU Anesthesia Type: General Level of consciousness: awake and alert Pain management: pain level controlled Vital Signs Assessment: post-procedure vital signs reviewed and stable Respiratory status: spontaneous breathing, nonlabored ventilation, respiratory function stable and patient connected to nasal cannula oxygen Cardiovascular status: blood pressure returned to baseline and stable Postop Assessment: no apparent nausea or vomiting Anesthetic complications: no   No notable events documented.  Last Vitals:  Vitals:   11/01/22 2145 11/02/22 0418  BP: (!) 142/87 130/74  Pulse: 70 76  Resp: 16 20  Temp: 36.7 C 36.5 C  SpO2: 96% 96%    Last Pain:  Vitals:   11/02/22 0418  TempSrc:   PainSc: 3                  Shelton Silvas

## 2022-11-02 NOTE — Plan of Care (Signed)
  Problem: Education: Goal: Knowledge of General Education information will improve Description: Including pain rating scale, medication(s)/side effects and non-pharmacologic comfort measures Outcome: Progressing   Problem: Health Behavior/Discharge Planning: Goal: Ability to manage health-related needs will improve Outcome: Progressing   Problem: Clinical Measurements: Goal: Ability to maintain clinical measurements within normal limits will improve Outcome: Progressing Goal: Will remain free from infection Outcome: Progressing Goal: Diagnostic test results will improve Outcome: Progressing Goal: Respiratory complications will improve Outcome: Progressing Goal: Cardiovascular complication will be avoided Outcome: Progressing   Problem: Nutrition: Goal: Adequate nutrition will be maintained Outcome: Progressing   Problem: Activity: Goal: Risk for activity intolerance will decrease Outcome: Progressing   Problem: Coping: Goal: Level of anxiety will decrease Outcome: Progressing   Problem: Nutrition: Goal: Adequate nutrition will be maintained Outcome: Progressing   Problem: Safety: Goal: Ability to remain free from injury will improve Outcome: Progressing   Problem: Skin Integrity: Goal: Risk for impaired skin integrity will decrease Outcome: Progressing

## 2022-11-02 NOTE — Progress Notes (Signed)
Attempted to see. Pt and spouse sleeping in bed. Will follow up with him tomorrow.

## 2022-11-03 LAB — CBC
HCT: 37.6 % — ABNORMAL LOW (ref 39.0–52.0)
Hemoglobin: 12.5 g/dL — ABNORMAL LOW (ref 13.0–17.0)
MCH: 31.9 pg (ref 26.0–34.0)
MCHC: 33.2 g/dL (ref 30.0–36.0)
MCV: 95.9 fL (ref 80.0–100.0)
Platelets: 246 10*3/uL (ref 150–400)
RBC: 3.92 MIL/uL — ABNORMAL LOW (ref 4.22–5.81)
RDW: 12.4 % (ref 11.5–15.5)
WBC: 4.8 10*3/uL (ref 4.0–10.5)
nRBC: 0 % (ref 0.0–0.2)

## 2022-11-03 NOTE — TOC CAGE-AID Note (Signed)
Transition of Care [facility]) - CAGE-AID Screening   Patient Details  Name: Zachary Romero MRN: [id number] Date of Birth: 08-Feb-1971  Transition of Care [facility]) CM/SW Contact:    Kermit Balo, RN Phone Number: 11/03/2022, 10:42 AM   Clinical Narrative: Pt denied the need for inpatient/ outpatient alcohol counseling resources.   CAGE-AID Screening:    Have You Ever Felt You Ought to Cut Down on Your Drinking or Drug Use?: No Have People Annoyed You By Critizing Your Drinking Or Drug Use?: No Have You Felt Bad Or Guilty About Your Drinking Or Drug Use?: No Have You Ever Had a Drink or Used Drugs First Thing In The Morning to Steady Your Nerves or to Get Rid of a Hangover?: No CAGE-AID Score: 0  Substance Abuse Education Offered: Yes (pt refused)
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
## 2022-11-04 ENCOUNTER — Other Ambulatory Visit (HOSPITAL_COMMUNITY): Payer: Self-pay

## 2022-11-04 DIAGNOSIS — S61451A Open bite of right hand, initial encounter: Secondary | ICD-10-CM | POA: Diagnosis not present

## 2022-11-04 DIAGNOSIS — L089 Local infection of the skin and subcutaneous tissue, unspecified: Secondary | ICD-10-CM | POA: Diagnosis not present

## 2022-11-04 LAB — CBC
HCT: 36.5 % — ABNORMAL LOW (ref 39.0–52.0)
Hemoglobin: 12.3 g/dL — ABNORMAL LOW (ref 13.0–17.0)
MCH: 31.7 pg (ref 26.0–34.0)
MCHC: 33.7 g/dL (ref 30.0–36.0)
MCV: 94.1 fL (ref 80.0–100.0)
Platelets: 264 10*3/uL (ref 150–400)
RBC: 3.88 MIL/uL — ABNORMAL LOW (ref 4.22–5.81)
RDW: 12.3 % (ref 11.5–15.5)
WBC: 4.5 10*3/uL (ref 4.0–10.5)
nRBC: 0 % (ref 0.0–0.2)

## 2022-11-04 MED ORDER — OXYCODONE HCL 5 MG PO TABS
5.0000 mg | ORAL_TABLET | Freq: Three times a day (TID) | ORAL | 0 refills | Status: AC | PRN
  Filled 2022-11-04: qty 15, 5d supply, fill #0

## 2022-11-04 MED ORDER — AMOXICILLIN-POT CLAVULANATE 875-125 MG PO TABS
1.0000 | ORAL_TABLET | Freq: Two times a day (BID) | ORAL | 0 refills | Status: AC
  Filled 2022-11-04: qty 14, 7d supply, fill #0

## 2022-11-04 MED ORDER — ADULT MULTIVITAMIN W/MINERALS CH: 1.0000 | ORAL_TABLET | Freq: Every day | ORAL | Status: DC
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
## 2022-11-04 NOTE — TOC Transition Note (Signed)
Transition of Care [facility]) - CM/SW Discharge Note   Patient Details  Name: Zachary Romero MRN: [id number] Date of Birth: 23-Nov-1970  Transition of Care [facility]) CM/SW Contact:  Kermit Balo, RN Phone Number: 11/04/2022, 11:50 AM   Clinical Narrative:    Pt is [place] back to the extended stay motel.  Pt has PCP information and outpt pharmacy information in his AVS.  Medications for home to be covered per CM and delivered to the room per [facility] pharmacy.  Cab voucher provided to the bedside RN for pts transportation home.   Final next level of care: Home/Self Care Barriers to Discharge: Inadequate or no insurance, Barriers Unresolved (comment)   Patient Goals and CMS Choice      Discharge Placement                         Discharge Plan and Services Additional resources added to the After Visit Summary for     Discharge Planning Services: CM Consult                                 Social Determinants of Health (SDOH) Interventions SDOH Screenings   Food Insecurity: No Food Insecurity (11/01/2022)  Housing: Low Risk  (11/01/2022)  Transportation Needs: No Transportation Needs (11/01/2022)  Utilities: Not At Risk (11/01/2022)  Tobacco Use: Low Risk  (11/02/2022)     Readmission Risk Interventions     No data to display
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
## 2022-11-04 NOTE — Plan of Care (Signed)
Completed/Met Add All Education: Knowledge of General Education information will improve Add Today at 1133 - Completed/Met by Leisa Lenz, RN Add Health Behavior/Discharge Planning: Ability to manage health-related needs will improve Add Today at 1133 - Completed/Met by Leisa Lenz, RN Add Clinical Measurements: Ability to maintain clinical measurements within normal limits will improve Add Today at 1133 - Completed/Met by Leisa Lenz, RN Add Will remain free from infection Add Today at 1133 - Completed/Met by Leisa Lenz, RN Add Diagnostic test results will improve Add Today at 1133 - Completed/Met by Leisa Lenz, RN Add Respiratory complications will improve Add Today at 1133 - Completed/Met by Leisa Lenz, RN Add Cardiovascular complication will be avoided Add Today at 1133 - Completed/Met by Leisa Lenz, RN Add Activity: Risk for activity intolerance will decrease Add Today at 1133 - Completed/Met by Leisa Lenz, RN Add Nutrition: Adequate nutrition will be maintained Add Today at 1133 - Completed/Met by Leisa Lenz, RN Add Coping: Level of anxiety will decrease Add Today at 1133 - Completed/Met by Leisa Lenz, RN Add Elimination: Will not experience complications related to bowel motility Add Today at 1133 - Completed/Met by Leisa Lenz, RN Add Will not experience complications related to urinary retention Add Today at 1133 - Completed/Met by Leisa Lenz, RN Add Pain Managment: General experience of comfort will improve Add Today at 1133 - Completed/Met by Leisa Lenz, RN Add Safety: Ability to remain free from injury will improve Add Today at 1133 - Completed/Met by Leisa Lenz, RN Add Skin Integrity: Risk for impaired skin integrity will decrease Add Today at 1133 - Completed/Met by Leisa Lenz, RN Add

## 2022-11-06 LAB — CULTURE, BLOOD (ROUTINE X 2)
Culture: NO GROWTH
Culture: NO GROWTH

## 2022-11-07 DIAGNOSIS — M7989 Other specified soft tissue disorders: Secondary | ICD-10-CM | POA: Diagnosis not present

## 2022-11-07 DIAGNOSIS — M79641 Pain in right hand: Secondary | ICD-10-CM | POA: Diagnosis not present

## 2022-11-07 DIAGNOSIS — Z4889 Encounter for other specified surgical aftercare: Secondary | ICD-10-CM | POA: Diagnosis not present

## 2022-11-07 DIAGNOSIS — T148XXA Other injury of unspecified body region, initial encounter: Secondary | ICD-10-CM | POA: Diagnosis not present

## 2022-11-07 DIAGNOSIS — M25641 Stiffness of right hand, not elsewhere classified: Secondary | ICD-10-CM | POA: Diagnosis not present

## 2022-11-07 DIAGNOSIS — L03113 Cellulitis of right upper limb: Secondary | ICD-10-CM | POA: Diagnosis not present

## 2022-11-07 DIAGNOSIS — M79644 Pain in right finger(s): Secondary | ICD-10-CM | POA: Diagnosis not present

## 2022-11-07 LAB — AEROBIC/ANAEROBIC CULTURE W GRAM STAIN (SURGICAL/DEEP WOUND)

## 2022-11-11 DIAGNOSIS — M79644 Pain in right finger(s): Secondary | ICD-10-CM | POA: Diagnosis not present

## 2022-11-11 DIAGNOSIS — M7989 Other specified soft tissue disorders: Secondary | ICD-10-CM | POA: Diagnosis not present

## 2022-11-11 DIAGNOSIS — M79641 Pain in right hand: Secondary | ICD-10-CM | POA: Diagnosis not present

## 2022-11-11 DIAGNOSIS — M25641 Stiffness of right hand, not elsewhere classified: Secondary | ICD-10-CM | POA: Diagnosis not present

## 2022-11-11 DIAGNOSIS — T148XXA Other injury of unspecified body region, initial encounter: Secondary | ICD-10-CM | POA: Diagnosis not present

## 2022-11-17 ENCOUNTER — Ambulatory Visit: Payer: Medicaid Other

## 2022-11-17 VITALS — BP 135/86 | HR 77 | Temp 97.7°F | Ht 79.0 in | Wt 261.6 lb

## 2022-11-17 DIAGNOSIS — Z789 Other specified health status: Secondary | ICD-10-CM

## 2022-11-17 DIAGNOSIS — E7800 Pure hypercholesterolemia, unspecified: Secondary | ICD-10-CM | POA: Diagnosis not present

## 2022-11-17 DIAGNOSIS — Z1211 Encounter for screening for malignant neoplasm of colon: Secondary | ICD-10-CM

## 2022-11-17 DIAGNOSIS — M5126 Other intervertebral disc displacement, lumbar region: Secondary | ICD-10-CM

## 2022-11-17 DIAGNOSIS — R3912 Poor urinary stream: Secondary | ICD-10-CM | POA: Diagnosis not present

## 2022-11-17 DIAGNOSIS — N529 Male erectile dysfunction, unspecified: Secondary | ICD-10-CM

## 2022-11-17 DIAGNOSIS — Z131 Encounter for screening for diabetes mellitus: Secondary | ICD-10-CM

## 2022-11-17 DIAGNOSIS — Z13220 Encounter for screening for lipoid disorders: Secondary | ICD-10-CM

## 2022-11-17 DIAGNOSIS — N5239 Other post-surgical erectile dysfunction: Secondary | ICD-10-CM | POA: Diagnosis not present

## 2022-11-17 DIAGNOSIS — N521 Erectile dysfunction due to diseases classified elsewhere: Secondary | ICD-10-CM | POA: Diagnosis not present

## 2022-11-17 DIAGNOSIS — Z1159 Encounter for screening for other viral diseases: Secondary | ICD-10-CM

## 2022-11-17 DIAGNOSIS — Z0000 Encounter for general adult medical examination without abnormal findings: Secondary | ICD-10-CM

## 2022-11-17 DIAGNOSIS — Z79890 Hormone replacement therapy (postmenopausal): Secondary | ICD-10-CM | POA: Diagnosis not present

## 2022-11-17 DIAGNOSIS — E876 Hypokalemia: Secondary | ICD-10-CM | POA: Diagnosis not present

## 2022-11-17 DIAGNOSIS — R7303 Prediabetes: Secondary | ICD-10-CM

## 2022-11-17 DIAGNOSIS — L089 Local infection of the skin and subcutaneous tissue, unspecified: Secondary | ICD-10-CM

## 2022-11-17 DIAGNOSIS — E785 Hyperlipidemia, unspecified: Secondary | ICD-10-CM | POA: Diagnosis not present

## 2022-11-17 DIAGNOSIS — Z5181 Encounter for therapeutic drug level monitoring: Secondary | ICD-10-CM | POA: Diagnosis not present

## 2022-11-17 MED ORDER — SILDENAFIL CITRATE 100 MG PO TABS
100.0000 mg | ORAL_TABLET | Freq: Every day | ORAL | 3 refills | Status: DC | PRN
Start: 2022-11-17 — End: 2023-03-17
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
## 2022-11-17 NOTE — Patient Instructions (Signed)
Thank you, Mr.Cara W Flammer for allowing [place] to provide your care today. Today we discussed :  Weak urinary stream: We will check your PSA, urinalysis and kidney function to evaluate for benign prostatic hyperplasia an dprostate cancer given your history.  Erectile dysfunction: refilled viagra.  Foot drop: Please follow up with your nuerosurgeon.  Screening for diabetes: Getting A1c.  Screening for high cholesterol: Getting lipid panel.  Screening for colon cancer: referred for colonoscopy.  I have ordered the following labs for you:   Lab Orders         BMP8+Anion Gap         Lipid Profile         Hepatitis C Ab reflex to Quant PCR         Urinalysis, Reflex Microscopic         PSA         POC Hbg A1C        Referrals ordered today:    Referral Orders         Ambulatory referral to Gastroenterology      I have ordered the following medication/changed the following medications:   Stop the following medications: Medications Discontinued During This Encounter  Medication Reason   sildenafil (VIAGRA) 100 MG tablet Reorder     Start the following medications: Meds ordered this encounter  Medications   sildenafil (VIAGRA) 100 MG tablet    Sig: Take 1 tablet (100 mg total) by mouth daily as needed for erectile dysfunction.    Dispense:  30 tablet    Refill:  3     Follow up: 3 months     We look forward to seeing you next time. Please call our clinic at [phone] if you have any questions or concerns. The best time to call is Monday-Friday from 9am-4pm, but there is someone available 24/7. If after hours or the weekend, call the main hospital number and ask for the Internal Medicine Resident On-Call. If you need medication refills, please notify your pharmacy one week in advance and they will send [place] a request.   Thank you for trusting me with your care. Wishing you the best!   Trevor Mace, MD [facility]

## 2022-11-18 LAB — PSA: Prostate Specific Ag, Serum: 1 ng/mL (ref 0.0–4.0)

## 2022-11-18 LAB — BMP8+ANION GAP
Anion Gap: 17 mmol/L (ref 10.0–18.0)
BUN/Creatinine Ratio: 21 — ABNORMAL HIGH (ref 9–20)
BUN: 14 mg/dL (ref 6–24)
CO2: 20 mmol/L (ref 20–29)
Calcium: 9.6 mg/dL (ref 8.7–10.2)
Chloride: 105 mmol/L (ref 96–106)
Creatinine, Ser: 0.67 mg/dL — ABNORMAL LOW (ref 0.76–1.27)
Glucose: 96 mg/dL (ref 70–99)
Potassium: 4.5 mmol/L (ref 3.5–5.2)
Sodium: 142 mmol/L (ref 134–144)
eGFR: 114 mL/min/{1.73_m2} (ref >59)

## 2022-11-18 LAB — LIPID PANEL
Chol/HDL Ratio: 5 ratio (ref 0.0–5.0)
Cholesterol, Total: 214 mg/dL — ABNORMAL HIGH (ref 100–199)
HDL: 43 mg/dL (ref >39)
LDL Chol Calc (NIH): 142 mg/dL — ABNORMAL HIGH (ref 0–99)
Triglycerides: 161 mg/dL — ABNORMAL HIGH (ref 0–149)
VLDL Cholesterol Cal: 29 mg/dL (ref 5–40)

## 2022-11-18 LAB — URINALYSIS, ROUTINE W REFLEX MICROSCOPIC
Bilirubin, UA: NEGATIVE
Glucose, UA: NEGATIVE
Ketones, UA: NEGATIVE
Leukocytes,UA: NEGATIVE
Nitrite, UA: NEGATIVE
Protein,UA: NEGATIVE
RBC, UA: NEGATIVE
Specific Gravity, UA: 1.02 (ref 1.005–1.030)
Urobilinogen, Ur: 0.2 mg/dL (ref 0.2–1.0)
pH, UA: 5.5 (ref 5.0–7.5)

## 2022-11-18 LAB — HEMOGLOBIN A1C
Est. average glucose Bld gHb Est-mCnc: 131 mg/dL
Hgb A1c MFr Bld: 6.2 % — ABNORMAL HIGH (ref 4.8–5.6)

## 2022-11-18 LAB — HCV AB W REFLEX TO QUANT PCR: HCV Ab: NONREACTIVE

## 2022-11-18 LAB — HCV INTERPRETATION

## 2022-11-19 DIAGNOSIS — Z0000 Encounter for general adult medical examination without abnormal findings: Secondary | ICD-10-CM | POA: Insufficient documentation

## 2022-11-19 DIAGNOSIS — R7303 Prediabetes: Secondary | ICD-10-CM | POA: Insufficient documentation

## 2022-11-19 DIAGNOSIS — Z13220 Encounter for screening for lipoid disorders: Secondary | ICD-10-CM | POA: Insufficient documentation

## 2022-11-19 DIAGNOSIS — R3912 Poor urinary stream: Secondary | ICD-10-CM | POA: Insufficient documentation

## 2022-11-19 DIAGNOSIS — N521 Erectile dysfunction due to diseases classified elsewhere: Secondary | ICD-10-CM | POA: Insufficient documentation

## 2022-11-19 DIAGNOSIS — Z789 Other specified health status: Secondary | ICD-10-CM | POA: Insufficient documentation

## 2022-11-19 DIAGNOSIS — E785 Hyperlipidemia, unspecified: Secondary | ICD-10-CM | POA: Insufficient documentation

## 2022-11-19 DIAGNOSIS — Z5181 Encounter for therapeutic drug level monitoring: Secondary | ICD-10-CM | POA: Insufficient documentation

## 2022-11-19 NOTE — Assessment & Plan Note (Addendum)
Patient with prior left sided L4-5 laminectomy and discectomy with Dr. Jordan Likes 08/2020. Reports no back pain since this time but does endorse some diminished sensation, weakness and foot drop that has been persistent since the surgery. Denies saddle anesthesia. Only followed up ost op once with Dr. Jordan Likes. On exam patient has 4/5 LLE strength in all joints and 5/5 RLE strength. Sensation to light touch grossly intact. Discussed that he should follow up with neurosurgery.

## 2022-11-19 NOTE — Assessment & Plan Note (Signed)
Patient reports taking 300mg  testosterone weekly for exercise enhancement during 2020 until he was incarcerated. Following incarceration additionally performed this regimen for 18 months. No testicular atrophy or gynecomastia. Patient wishing to have testing of his levels. Discussed that he would need two 8AM testosterone levels checked, will order lab only visits for him.

## 2022-11-19 NOTE — Assessment & Plan Note (Signed)
Endorses erectile dysfunction since his lumbar discectomy. Reports good results with Viagra which was refilled.
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
## 2022-11-19 NOTE — Assessment & Plan Note (Signed)
LDL on screening labs noted to be 142. ASCVD risk 4.9% is low. Can counsel on lifestyle modification at follow up.

## 2022-11-19 NOTE — Assessment & Plan Note (Signed)
Patient endorses drinking 4-6 beers a day. No history of alcohol withdrawal symptoms. Says he feels he can stop if he wishes, appears pre-contemplative. Continue to discuss cessation.

## 2022-11-19 NOTE — Assessment & Plan Note (Addendum)
Patient reports weak urinary stream, starting and stopping, and urinary dribbling since his laminectomy. Denies burning or hematuria. Denies weight loss, night sweats, fever or chills. Endorses family history of prostate cancer. UA without abnormalities. PSA found to be 1. Do suspect this is post surgical changes versus BPH. Would perform formal questionnaire at follow up visit such as IPSS or AUA-SI. If meets BPH criteria, no 5 alpha reductase inhibitor indicated unless PSA >1.5 ng/mL . May benefit from alpha blocker. May also benefit from urology referral given LUTS with prior lumbar spinal cord disease.

## 2022-11-19 NOTE — Assessment & Plan Note (Signed)
HCV screening completed. Referral for colonoscopy placed.
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
## 2022-11-19 NOTE — Assessment & Plan Note (Signed)
A1c 6.2 consistent with prediabetes. Will need annual A1c. Can discuss lifestyle modifications at next visit.

## 2022-11-22 NOTE — Progress Notes (Signed)
Internal Medicine Clinic Attending  Case discussed with Dr. Aundria Rud  at the time of the visit.  We reviewed the resident's history and exam and pertinent patient test results.  I agree with the assessment, diagnosis, and plan of care documented in the resident's note.   Would only treat with testosterone if AM T is < 200 on 2 occasions.

## 2022-11-25 ENCOUNTER — Other Ambulatory Visit: Payer: Medicaid Other

## 2022-11-25 NOTE — Progress Notes (Signed)
Attempted to call multiple times, left voicemail. Will leave mychart message. Kidney function and electrolytes normal. LDL elevated to 1423 but ASCVD around 5%. No statin indicated for primary prevention at this time. PSA 1, no evidence of prostate cancer. Given PSA <1.5 no finasteride indicated. Can perform LUTS screening at next visit and start tamsulosin. Does have prediabetes, will need annual A1c and lifestyle modification discussions.

## 2022-11-25 NOTE — Progress Notes (Signed)
Please note LDL is 142.  Agree with plan.

## 2022-11-28 ENCOUNTER — Other Ambulatory Visit (INDEPENDENT_AMBULATORY_CARE_PROVIDER_SITE_OTHER): Payer: Medicaid Other

## 2022-11-28 DIAGNOSIS — Z5181 Encounter for therapeutic drug level monitoring: Secondary | ICD-10-CM

## 2022-11-28 DIAGNOSIS — M25641 Stiffness of right hand, not elsewhere classified: Secondary | ICD-10-CM | POA: Diagnosis not present

## 2022-11-28 DIAGNOSIS — Z79890 Hormone replacement therapy (postmenopausal): Secondary | ICD-10-CM

## 2022-11-28 DIAGNOSIS — M79644 Pain in right finger(s): Secondary | ICD-10-CM | POA: Diagnosis not present

## 2022-11-28 DIAGNOSIS — L03113 Cellulitis of right upper limb: Secondary | ICD-10-CM | POA: Diagnosis not present

## 2022-11-28 DIAGNOSIS — T148XXA Other injury of unspecified body region, initial encounter: Secondary | ICD-10-CM | POA: Diagnosis not present

## 2022-11-30 LAB — TESTOSTERONE: Testosterone: 178 ng/dL — ABNORMAL LOW (ref 264–916)

## 2023-02-15 ENCOUNTER — Telehealth: Payer: Self-pay | Admitting: Gastroenterology

## 2023-02-15 ENCOUNTER — Ambulatory Visit: Payer: Medicaid Other | Admitting: Gastroenterology

## 2023-02-15 NOTE — Progress Notes (Deleted)
   HPI :  No prior GI records   Past Medical History:  Diagnosis Date   Back pain      Past Surgical History:  Procedure Laterality Date   FACIAL FRACTURE SURGERY     I & D EXTREMITY Right 11/01/2022   Procedure: IRRIGATION AND DEBRIDEMENT OF HAND;  Surgeon: Betha Loa, MD;  Location: [facility] OR;  Service: Orthopedics;  Laterality: Right;   LUMBAR LAMINECTOMY/DECOMPRESSION MICRODISCECTOMY Left 09/22/2020   Procedure: LEFT LUMBAR FOUR - LUMABR FIVE LAMINECTOMY WITH MICRODISCECTOMY;  Surgeon: Julio Sicks, MD;  Location: [facility] OR;  Service: Neurosurgery;  Laterality: Left;   No family history on file. Social History   Tobacco Use   Smoking status: Never   Smokeless tobacco: Never  Substance Use Topics   Alcohol use: Yes   Drug use: Yes    Types: Cocaine   Current Outpatient Medications  Medication Sig Dispense Refill   cyclobenzaprine (FLEXERIL) 10 MG tablet Take 1 tablet (10 mg total) by mouth 3 (three) times daily as needed for muscle spasms. (Patient not taking: Reported on 11/01/2022) 30 tablet 0   ibuprofen (ADVIL,MOTRIN) 800 MG tablet Take 1 tablet (800 mg total) every 8 (eight) hours as needed by mouth for mild pain. 30 tablet 0   Multiple Vitamin (MULTIVITAMIN WITH MINERALS) TABS tablet Take 1 tablet by mouth daily.     sildenafil (VIAGRA) 100 MG tablet Take 1 tablet (100 mg total) by mouth daily as needed for erectile dysfunction. 30 tablet 3   No current facility-administered medications for this visit.   No Known Allergies   Review of Systems: All systems reviewed and negative except where noted in HPI.    No results found.  Physical Exam: There were no vitals taken for this visit. Constitutional: Pleasant,well-developed, ***male in no acute distress. HEENT: Normocephalic and atraumatic. Conjunctivae are normal. No scleral icterus. Neck supple.  Cardiovascular: Normal rate, regular rhythm.  Pulmonary/chest: Effort normal and breath sounds normal. No wheezing,  rales or rhonchi. Abdominal: Soft, nondistended, nontender. Bowel sounds active throughout. There are no masses palpable. No hepatomegaly. Extremities: no edema Lymphadenopathy: No cervical adenopathy noted. Neurological: Alert and oriented to person place and time. Skin: Skin is warm and dry. No rashes noted. Psychiatric: Normal mood and affect. Behavior is normal.   ASSESSMENT: 52 y.o. male here for assessment of the following  No diagnosis found.  PLAN:   Inez Catalina, MD

## 2023-02-15 NOTE — Telephone Encounter (Signed)
PT called to cancel appointment for today at 9am

## 2023-02-21 ENCOUNTER — Encounter: Payer: Medicaid Other | Admitting: Student

## 2023-02-21 NOTE — Progress Notes (Deleted)
52 year old male with HLD, ED, BPH, prediabetes, prior testosterone use 11/17/2022  Testosterone open 78 [phone]) for males age 56-39

## 2023-03-15 DIAGNOSIS — L03113 Cellulitis of right upper limb: Secondary | ICD-10-CM | POA: Diagnosis not present

## 2023-03-15 DIAGNOSIS — M00241 Other streptococcal arthritis, right hand: Secondary | ICD-10-CM | POA: Diagnosis not present

## 2023-03-17 ENCOUNTER — Ambulatory Visit: Payer: Medicaid Other | Admitting: Student

## 2023-03-17 DIAGNOSIS — N521 Erectile dysfunction due to diseases classified elsewhere: Secondary | ICD-10-CM | POA: Diagnosis not present

## 2023-03-17 MED ORDER — TADALAFIL 10 MG PO TABS
10.0000 mg | ORAL_TABLET | ORAL | 1 refills | Status: DC | PRN
Start: 2023-03-17 — End: 2023-03-17

## 2023-03-17 MED ORDER — TADALAFIL 10 MG PO TABS
10.0000 mg | ORAL_TABLET | ORAL | 1 refills | Status: AC | PRN
Start: 2023-03-17 — End: ?

## 2023-03-17 NOTE — Progress Notes (Signed)
Patient left before being seen but was in the room with his partners visit and asked to be changed from Viagra to Cialis which she has been on the past and was confirmed by pharmacy fill data.  This change was made but we were unable to speak about other things due to time commitment on their part which necessitated his departure before his appointment time.  No charge.

## 2023-04-20 ENCOUNTER — Ambulatory Visit (INDEPENDENT_AMBULATORY_CARE_PROVIDER_SITE_OTHER): Payer: Medicaid Other | Admitting: Student

## 2023-04-20 ENCOUNTER — Other Ambulatory Visit: Payer: Self-pay

## 2023-04-20 ENCOUNTER — Encounter: Payer: Self-pay | Admitting: Student

## 2023-04-20 VITALS — BP 140/86 | HR 79 | Temp 98.1°F | Wt 278.2 lb

## 2023-04-20 DIAGNOSIS — R7303 Prediabetes: Secondary | ICD-10-CM | POA: Diagnosis not present

## 2023-04-20 DIAGNOSIS — N521 Erectile dysfunction due to diseases classified elsewhere: Secondary | ICD-10-CM | POA: Diagnosis not present

## 2023-04-20 DIAGNOSIS — E7800 Pure hypercholesterolemia, unspecified: Secondary | ICD-10-CM | POA: Diagnosis not present

## 2023-04-20 DIAGNOSIS — H6192 Disorder of left external ear, unspecified: Secondary | ICD-10-CM

## 2023-04-20 DIAGNOSIS — Z13220 Encounter for screening for lipoid disorders: Secondary | ICD-10-CM

## 2023-04-20 DIAGNOSIS — Z1211 Encounter for screening for malignant neoplasm of colon: Secondary | ICD-10-CM

## 2023-04-20 MED ORDER — SILDENAFIL CITRATE 50 MG PO TABS: 50.0000 mg | ORAL_TABLET | Freq: Every day | ORAL | 3 refills | Status: DC | PRN

## 2023-04-20 NOTE — Patient Instructions (Addendum)
Thank you, [name] for allowing [place] to provide your care today. Today we discussed your general health and your concern for cancer in your ear.   - I will refer you to a dermatologist for your ear  - Please check and record your blood pressure at home  - I will see you again in 3 months - I sent Viagra to your pharmacy   I have ordered the following labs for you:  Lab Orders  No laboratory test(s) ordered today     Tests ordered today  Referrals ordered today:   Referral Orders  No referral(s) requested today     I have ordered the following medication/changed the following medications:   Stop the following medications: There are no discontinued medications.   Start the following medications: Meds ordered this encounter  Medications   sildenafil (VIAGRA) 50 MG tablet    Sig: Take 1 tablet (50 mg total) by mouth daily as needed for erectile dysfunction.    Dispense:  30 tablet    Refill:  3     Follow up: 3 months    Should you have any questions or concerns please call the internal medicine clinic at [phone].    Kathleen Lime, M.D [facility]

## 2023-04-20 NOTE — Progress Notes (Signed)
CC: Ear lesion  HPI:  Zachary Romero is a 52 y.o. male living with a history stated below and presents today for ear lesion and also his other chronic conditions. Please see problem based assessment and plan for additional details.  Past Medical History:  Diagnosis Date   Back pain     Current Outpatient Medications on File Prior to Visit  Medication Sig Dispense Refill   cyclobenzaprine (FLEXERIL) 10 MG tablet Take 1 tablet (10 mg total) by mouth 3 (three) times daily as needed for muscle spasms. (Patient not taking: Reported on 11/01/2022) 30 tablet 0   ibuprofen (ADVIL,MOTRIN) 800 MG tablet Take 1 tablet (800 mg total) every 8 (eight) hours as needed by mouth for mild pain. 30 tablet 0   Multiple Vitamin (MULTIVITAMIN WITH MINERALS) TABS tablet Take 1 tablet by mouth daily.     tadalafil (CIALIS) 10 MG tablet Take 1 tablet (10 mg total) by mouth as needed for erectile dysfunction. 20 tablet 1   No current facility-administered medications on file prior to visit.    No family history on file.  Social History   Socioeconomic History   Marital status: Divorced    Spouse name: Not on file   Number of children: Not on file   Years of education: Not on file   Highest education level: Not on file  Occupational History   Not on file  Tobacco Use   Smoking status: Never   Smokeless tobacco: Never  Substance and Sexual Activity   Alcohol use: Yes   Drug use: Yes    Types: Cocaine   Sexual activity: Not on file  Other Topics Concern   Not on file  Social History Narrative   Not on file   Social Determinants of Health   Financial Resource Strain: Not on file  Food Insecurity: High Risk (03/15/2023)   Received from [facility]   Hunger Vital Sign    Worried About Running Out of Food in the Last Year: Often true    Ran Out of Food in the Last Year: Often true  Transportation Needs: Unmet Transportation Needs (03/15/2023)   Received from [employer]    In the past 12 months, has lack of reliable transportation kept you from medical appointments, meetings, work or from getting things needed for daily living? : Yes  Physical Activity: Not on file  Stress: Not on file  Social Connections: Not on file  Intimate Partner Violence: Not At Risk (11/01/2022)   Humiliation, Afraid, Rape, and Kick questionnaire    Fear of Current or Ex-Partner: No    Emotionally Abused: No    Physically Abused: No    Sexually Abused: No    Review of Systems: ROS negative except for what is noted on the assessment and plan.  There were no vitals filed for this visit.  Physical Exam: Constitutional: Well-appearing man sitting in chair in no acute distress HENT: Nonbleeding ear lesion, no telangiectasis appreciated(please see below)  Cardiovascular: regular rate and rhythm, no m/r/g Pulmonary/Chest: normal work of breathing on room air, lungs clear to auscultation bilaterally Skin: warm and dry Psych: Normal mood and affect  Assessment & Plan:   No problem-specific Assessment & Plan notes found for this encounter.   Ear lesion Patient presented due to concerns for a left lobe ear lesion.  Please refer to image and the physical exam above.  He reports the lesion is  not painful and has not noticed any  bleeding.  Says he has been playing golf for the past 6 years and is usually in the sun for a long time. The lesion does not have an unusual even color, not itching, no crusts and has not change in shape or size for the past 6 months, less concern for melanoma at this time. Wondering if this could be squamous cell carcinoma versus basal cell or a benign lesion.  Would consider referring patient to a dermatologist for further assessment. -Ambulatory referral to dermatology , prefers [facility],  ( DAVID, JENNIFER DO)  Prediabetes Last A1c in June was 6.2, patient prefers lifestyle modification and dietary changes done initiating the medical  therapy at this time. -Counseled on lifestyle modification and dietary changes -Recheck A1c in 3 to 4 months  Erectile dysfunction History of erectile dysfunction, currently being managed with tadalafil.  Patient fails tadalafil is not working for him and would like to switch back to Viagra.  Potential side effect of Viagra adequately discussed with the patient - Switch tadalafil to Viagra.  Encounter for healthcare maintenance Patient is due for colonoscopy.  He is scheduled with GI for that on June 06, 2023. - Follow-up on colonoscopy results  Hypertension Blood pressure in the office today is 140/86.  Patient is not agreeable to initiate medical therapy at this time.  Patient advised to record blood pressures at home and bring it to his next visit in 3 months -If blood pressure persistently continue to be high at the next visit please consider initiating an ARB at that point  Hyperlipidemia LDL 5 months ago was 142, ASCVD risk is 5.2%.  Patient is not amenable to starting statin therapy at this time.  Concerns for potential cardiovascular disease in the setting of high blood pressure adequately discussed with the patient.  Patient agrees to modify his lifestyle and also watch his diet. - Recheck lipid panel in the next visit, if LDL still elevated, consider initiating statin therapy  Patient seen with Dr. Heloise Beecham, M.D [facility] Phone: [phone] Date 04/20/2023 Time 9:26 AM

## 2023-04-20 NOTE — Telephone Encounter (Signed)
Medication was sent to the wrong pharmacy.  Medication sent to pharmacy.

## 2023-04-21 NOTE — Progress Notes (Signed)
Internal Medicine Clinic Attending  I was physically present during the key portions of the resident provided service and participated in the medical decision making of patient's management care. I reviewed pertinent patient test results.  The assessment, diagnosis, and plan were formulated together and I agree with the documentation in the resident's note.  Mercie Eon, MD   The 10-year ASCVD risk score (Arnett DK, et al., 2019) is: 5.2%   Values used to calculate the score:     Age: 51 years     Sex: Male     Is Non-Hispanic African American: No     Diabetic: No     Tobacco smoker: No     Systolic Blood Pressure: 140 mmHg     Is BP treated: No     HDL Cholesterol: 43 mg/dL     Total Cholesterol: 214 mg/dL    Today, I discussed with patient that his elevated blood pressure and cholesterol put him at an increased risk for cardiovascular disease and events like heart attack or stroke. He shared that he has been more sedentary this year and is very interested in making lifestyle changes to be more active and eat healthy foods. He would like to avoid adding medicine at this time, and I think it's very reasonable to try 3 months of lifestyle modifications and home BP monitoring. If home BP elevated at next visit, start hypertensive med. Repeat lipid panel at next visit - if ASCVD risk is elevated, I'd recommend a statin too.

## 2023-05-11 ENCOUNTER — Ambulatory Visit: Payer: Medicaid Other | Admitting: Dermatology

## 2023-05-11 ENCOUNTER — Encounter: Payer: Self-pay | Admitting: Dermatology

## 2023-05-11 VITALS — BP 136/86

## 2023-05-11 DIAGNOSIS — C44229 Squamous cell carcinoma of skin of left ear and external auricular canal: Secondary | ICD-10-CM

## 2023-05-11 DIAGNOSIS — D485 Neoplasm of uncertain behavior of skin: Secondary | ICD-10-CM

## 2023-05-11 DIAGNOSIS — D492 Neoplasm of unspecified behavior of bone, soft tissue, and skin: Secondary | ICD-10-CM

## 2023-05-11 NOTE — Progress Notes (Signed)
   New Patient Visit   Subjective  Zachary Romero is a 52 y.o. male who presents for the following: Scaly spot of left ear. He is not sure how long it has been there but has gotten sore in the last couple of weeks. He has not previously had any skin cancer. It is tender and bleeding.  The following portions of the chart were reviewed this encounter and updated as appropriate: medications, allergies, medical history  Review of Systems:  No other skin or systemic complaints except as noted in HPI or Assessment and Plan.  Objective  Well appearing patient in no apparent distress; mood and affect are within normal limits.  A focused examination was performed of the following areas: Left ear  Relevant exam findings are noted in the Assessment and Plan.  Left helix 1.0 x 0.8 cm pink scaly hyperkeratotic papule   Assessment & Plan     NEOPLASM OF UNCERTAIN BEHAVIOR OF SKIN Left helix Skin / nail biopsy Type of biopsy: tangential   Informed consent: discussed and consent obtained   Timeout: patient name, date of birth, surgical site, and procedure verified   Procedure prep:  Patient was prepped and draped in usual sterile fashion Prep type:  Isopropyl alcohol Anesthesia: the lesion was anesthetized in a standard fashion   Anesthetic:  1% lidocaine w/ epinephrine 1-100,000 buffered w/ 8.4% NaHCO3 Instrument used: flexible razor blade   Hemostasis achieved with: pressure, aluminum chloride and electrodesiccation   Outcome: patient tolerated procedure well   Post-procedure details: sterile dressing applied and wound care instructions given   Dressing type: bandage and petrolatum   Specimen 1 - Surgical pathology Differential Diagnosis: R/O NMSC  Check Margins: No  Return pending biopsy results.  I, Joanie Coddington, CMA, am acting as scribe for Gwenith Daily, MD .   Documentation: I have reviewed the above documentation for accuracy and completeness, and I agree with the  above.  Gwenith Daily, MD

## 2023-05-11 NOTE — Patient Instructions (Signed)
Patient Handout: Wound Care for Skin Biopsy Site  Taking Care of Your Skin Biopsy Site  Proper care of the biopsy site is essential for promoting healing and minimizing scarring. This handout provides instructions on how to care for your biopsy site to ensure optimal recovery.  1. Cleaning the Wound:  Clean the biopsy site daily with gentle soap and water. Gently pat the area dry with a clean, soft towel. Avoid harsh scrubbing or rubbing the area, as this can irritate the skin and delay healing. 2. Applying Aquaphor and Bandage:  After cleaning the wound, apply a thin layer of Aquaphor ointment to the biopsy site. Cover the area with a sterile bandage to protect it from dirt, bacteria, and friction. Change the bandage daily or as needed if it becomes soiled or wet. 3. Continued Care for One Week:  Repeat the cleaning, Aquaphor application, and bandaging process daily for one week following the biopsy procedure. Keeping the wound clean and moist during this initial healing period will help prevent infection and promote optimal healing. 4. Massaging Aquaphor into the Area:  After one week, discontinue the use of bandages but continue to apply Aquaphor to the biopsy site. Gently massage the Aquaphor into the area using circular motions. Massaging the skin helps to promote circulation and prevent the formation of scar tissue. Additional Tips:  Avoid exposing the biopsy site to direct sunlight during the healing process, as this can cause hyperpigmentation or worsen scarring. If you experience any signs of infection, such as increased redness, swelling, warmth, or drainage from the wound, contact your healthcare provider immediately. Follow any additional instructions provided by your healthcare provider for caring for the biopsy site and managing any discomfort. Conclusion:  Taking proper care of your skin biopsy site is crucial for ensuring optimal healing and minimizing scarring. By  following these instructions for cleaning, applying Aquaphor, and massaging the area, you can promote a smooth and successful recovery. If you have any questions or concerns about caring for your biopsy site, don't hesitate to contact your healthcare provider for guidance.  

## 2023-05-15 ENCOUNTER — Encounter: Payer: Medicaid Other | Admitting: Student

## 2023-05-16 LAB — SURGICAL PATHOLOGY

## 2023-05-18 ENCOUNTER — Telehealth: Payer: Self-pay | Admitting: Dermatology

## 2023-05-18 NOTE — Telephone Encounter (Signed)
-----   Message from [facility] sent at 05/17/2023  3:26 PM EST ----- SCC- left helix- Mohs with me   Please call patient to discuss diagnosis and schedule for Mohs surgery.

## 2023-05-18 NOTE — Telephone Encounter (Signed)
I called and spoke to the patient and went over results and treament plan.  Pt stated he had no questions and was fine with me sending it to scheduling.

## 2023-05-26 ENCOUNTER — Encounter: Payer: Self-pay | Admitting: Dermatology

## 2023-05-29 ENCOUNTER — Encounter: Payer: Medicaid Other | Admitting: Dermatology

## 2023-06-06 ENCOUNTER — Encounter: Payer: Self-pay | Admitting: Internal Medicine

## 2023-06-06 ENCOUNTER — Ambulatory Visit: Payer: Medicaid Other | Admitting: Internal Medicine

## 2023-06-06 ENCOUNTER — Other Ambulatory Visit (INDEPENDENT_AMBULATORY_CARE_PROVIDER_SITE_OTHER): Payer: Medicaid Other

## 2023-06-06 VITALS — BP 160/90 | HR 85 | Ht 79.0 in | Wt 278.0 lb

## 2023-06-06 DIAGNOSIS — Z01818 Encounter for other preprocedural examination: Secondary | ICD-10-CM | POA: Diagnosis not present

## 2023-06-06 DIAGNOSIS — R7982 Elevated C-reactive protein (CRP): Secondary | ICD-10-CM

## 2023-06-06 DIAGNOSIS — D649 Anemia, unspecified: Secondary | ICD-10-CM

## 2023-06-06 DIAGNOSIS — Z1211 Encounter for screening for malignant neoplasm of colon: Secondary | ICD-10-CM

## 2023-06-06 DIAGNOSIS — K5900 Constipation, unspecified: Secondary | ICD-10-CM | POA: Diagnosis not present

## 2023-06-06 LAB — CBC WITH DIFFERENTIAL/PLATELET
Basophils Absolute: 0 10*3/uL (ref 0.0–0.1)
Basophils Relative: 0.6 % (ref 0.0–3.0)
Eosinophils Absolute: 0.2 10*3/uL (ref 0.0–0.7)
Eosinophils Relative: 3.1 % (ref 0.0–5.0)
HCT: 48.6 % (ref 39.0–52.0)
Hemoglobin: 16.6 g/dL (ref 13.0–17.0)
Lymphocytes Relative: 27.8 % (ref 12.0–46.0)
Lymphs Abs: 1.5 10*3/uL (ref 0.7–4.0)
MCHC: 34.2 g/dL (ref 30.0–36.0)
MCV: 98.2 fL (ref 78.0–100.0)
Monocytes Absolute: 0.7 10*3/uL (ref 0.1–1.0)
Monocytes Relative: 13.1 % — ABNORMAL HIGH (ref 3.0–12.0)
Neutro Abs: 3 10*3/uL (ref 1.4–7.7)
Neutrophils Relative %: 55.4 % (ref 43.0–77.0)
Platelets: 274 10*3/uL (ref 150.0–400.0)
RBC: 4.94 Mil/uL (ref 4.22–5.81)
RDW: 13.4 % (ref 11.5–15.5)
WBC: 5.4 10*3/uL (ref 4.0–10.5)

## 2023-06-06 LAB — VITAMIN B12: Vitamin B-12: 890 pg/mL (ref 211–911)

## 2023-06-06 LAB — RETICULOCYTES
ABS Retic: 71400 {cells}/uL (ref 25000–90000)
Retic Ct Pct: 1.4 %

## 2023-06-06 LAB — IBC + FERRITIN
Ferritin: 85.9 ng/mL (ref 22.0–322.0)
Iron: 140 ug/dL (ref 42–165)
Saturation Ratios: 37.5 % (ref 20.0–50.0)
TIBC: 373.8 ug/dL (ref 250.0–450.0)
Transferrin: 267 mg/dL (ref 212.0–360.0)

## 2023-06-06 LAB — FOLATE: Folate: 10.1 ng/mL (ref >5.9)

## 2023-06-06 LAB — C-REACTIVE PROTEIN: CRP: <1 mg/dL (ref 0.5–20.0)

## 2023-06-06 MED ORDER — NA SULFATE-K SULFATE-MG SULF 17.5-3.13-1.6 GM/177ML PO SOLN: ORAL | 0 refills | Status: DC

## 2023-06-06 NOTE — Progress Notes (Signed)
 Chief Complaint: Constipation  HPI : 53 year old male with history of alcohol use, ruptured disc s/p surgical repair presents with constipation  Patient presents with his fiance to clinic today.  Patient states that he has had some abdominal discomfort due to bloating over the last year.  He has felt off.  His stools have not been normal and he feels like he is not clearing out his stools when he does have a BM.  Endorses loose stools and some occasional stool leakage.  Patient has tried probiotics to see if this helps.  Denies any rectal bleeding.  He has about 3-4 bowel movements per day on average.  Patient has also gained some weight over this period of time.  Denies nausea or vomiting.  Endorses some dysphagia for the last 1.5 years to the both solids and liquids.  Endorses some acid reflux on occasion for which she does not take any medications.  Denies any NSAID use.  Denies prior colonoscopy.  Patient has previously had a trach, which is subsequently been removed.  Patient believes that he had a laryngoscopy at that time.  Denies any prior EGD procedure.  Patient's great uncle had stomach cancer.  He drinks 4-8 alcoholic beverages per day.  Patient also has had a history of a ruptured disc that was repaired surgically and he still has some residual symptoms such as weakness in his left lower extremity.  When his ruptured disc was particularly severe, he was also having some bladder dysfunction.   Past Medical History:  Diagnosis Date   Back pain    Cellulitis of right upper extremity 11/02/2022   Infection of right hand due to bite 11/01/2022     Past Surgical History:  Procedure Laterality Date   FACIAL FRACTURE SURGERY     I & D EXTREMITY Right 11/01/2022   Procedure: IRRIGATION AND DEBRIDEMENT OF HAND;  Surgeon: Murrell Drivers, MD;  Location: [facility] OR;  Service: Orthopedics;  Laterality: Right;   LUMBAR LAMINECTOMY/DECOMPRESSION MICRODISCECTOMY Left 09/22/2020   Procedure: LEFT LUMBAR  FOUR - LUMABR FIVE LAMINECTOMY WITH MICRODISCECTOMY;  Surgeon: Louis Shove, MD;  Location: [facility] OR;  Service: Neurosurgery;  Laterality: Left;   Family History  Problem Relation Age of Onset   Ovarian cancer Mother    Liver disease Neg Hx    Esophageal cancer Neg Hx    Colon cancer Neg Hx    Social History   Tobacco Use   Smoking status: Never   Smokeless tobacco: Never  Vaping Use   Vaping status: Never Used  Substance Use Topics   Alcohol use: Yes    Alcohol/week: 5.0 standard drinks of alcohol    Types: 5 Cans of beer per week   Drug use: Yes    Types: Cocaine   Current Outpatient Medications  Medication Sig Dispense Refill   cyclobenzaprine  (FLEXERIL ) 10 MG tablet Take 1 tablet (10 mg total) by mouth 3 (three) times daily as needed for muscle spasms. 30 tablet 0   ibuprofen  (ADVIL ,MOTRIN ) 800 MG tablet Take 1 tablet (800 mg total) every 8 (eight) hours as needed by mouth for mild pain. 30 tablet 0   Multiple Vitamin (MULTIVITAMIN WITH MINERALS) TABS tablet Take 1 tablet by mouth daily.     Na Sulfate-K Sulfate-Mg Sulf 17.5-3.13-1.6 GM/177ML SOLN Use as directed; may use generic; goodrx card if insurance will not cover generic 354 mL 0   sildenafil  (VIAGRA ) 50 MG tablet Take 1 tablet (50 mg total) by mouth daily as  needed for erectile dysfunction. 30 tablet 3   tadalafil  (CIALIS ) 10 MG tablet Take 1 tablet (10 mg total) by mouth as needed for erectile dysfunction. 20 tablet 1   No current facility-administered medications for this visit.   No Known Allergies   Review of Systems: All systems reviewed and negative except where noted in HPI.   Physical Exam: BP (!) 160/90   Pulse 85   Ht 6' 7 (2.007 m)   Wt 278 lb (126.1 kg)   BMI 31.32 kg/m  Constitutional: Pleasant,well-developed, male in no acute distress. HEENT: Normocephalic and atraumatic. Conjunctivae are normal. No scleral icterus. Cardiovascular: Normal rate, regular rhythm.  Pulmonary/chest: Effort normal  and breath sounds normal. No wheezing, rales or rhonchi. Abdominal: Soft, nondistended, nontender. Bowel sounds active throughout. There are no masses palpable. No hepatomegaly. Extremities: Trace left sided edema Neurological: Alert and oriented to person place and time. Skin: Skin is warm and dry. No rashes noted. Psychiatric: Normal mood and affect. Behavior is normal.  Labs 10/2022: CBC with low Hb of 12.3. BMP unremarkable. HCV antibody NR. CRP elevated at 8.3. LFTs nml.   ASSESSMENT AND PLAN: Colon cancer screening Loose stools, inadequate evacuation of stool, fecal incontinence Bloating Anemia Elevated CRP Dysphagia Patient presents primarily to discuss getting a colonoscopy for colon cancer screening.  Patient has noted some changes in his bowel habits over the last year.  He endorses issues with bloating as well as loose stools.  Also states that he does not feel like he clears out stool fully when he passes a BM and has also noticed some fecal incontinence.  I do wonder whether or not patient may be having some overflow incontinence and bloating as a result of constipation issues. I have asked him to start a daily fiber supplement to see if this helps regulate his bowel habits.  For his bloating, I also recommended that he try the low FODMAP diet and we will plan for SIBO breath test for further evaluation.  Will also check some labs and plan for a colonoscopy for colon cancer screening.  Patient previously had an elevated CRP in 10/2022, though this was when he was actively dealing with an infection, which may explain his elevated CRP.  I went over the colonoscopy procedure in detail with the patient, discussing risks such as infection, perforation, bleeding, and changes in vital signs.  Patient is agreeable to the colonoscopy procedure.  Patient does describe some dysphagia, though this may be related to his prior tracheostomy.  - Low FODMAP diet - Drink 8 cups of water per day, walk 30  min, daily dose of Metamucil - Check CBC, ferritin/IBC, vitamin B12, folate, reticulocyte, CRP - SIBO breath test - Colonoscopy [facility] - Declined EGD for now - Counseled on reducing alcohol use if possible  Estefana Kidney, MD  I spent 62 minutes of time, including in depth chart review, independent review of results as outlined above, communicating results with the patient directly, face-to-face time with the patient, coordinating care, ordering studies and medications as appropriate, and documentation.

## 2023-06-06 NOTE — Patient Instructions (Addendum)
 Your provider has requested that you go to the basement level for lab work before leaving today. Press B on the elevator. The lab is located at the first door on the left as you exit the elevator.  You have been given a testing kit to check for small intestine bacterial overgrowth (SIBO) which is completed by a company named [facility]. Make sure to return your test in the mail using the return mailing label given to you along with the kit. The test order, your demographic and insurance information have all already been sent to the company. [facility] will collect an upfront charge of $99.74 for commercial insurance plans and $209.74 if you are paying cash. Make sure to discuss with [facility] PRIOR to having the test to see if they have gotten information from your insurance company as to how much your testing will cost out of pocket, if any. Please contact [facility] at phone number [phone] to get instructions regarding how to perform the test as our office is unable to give specific testing instructions.  You have been scheduled for a colonoscopy. Please follow written instructions given to you at your visit today.   Please pick up your prep supplies at the pharmacy within the next 1-3 days.  If you use inhalers (even only as needed), please bring them with you on the day of your procedure.  DO NOT TAKE 7 DAYS PRIOR TO TEST- Trulicity (dulaglutide) Ozempic, Wegovy (semaglutide) Mounjaro (tirzepatide) Bydureon Bcise (exanatide extended release)  DO NOT TAKE 1 DAY PRIOR TO YOUR TEST Rybelsus (semaglutide) Adlyxin (lixisenatide) Victoza (liraglutide) Byetta (exanatide) ___________________________________________________________________________   We have sent the following medications to your pharmacy for you to pick up at your convenience: Suprep  Drink 8 cups of water a day and walk 30 minutes a day.  Please purchase the following medications over the counter  and take as directed: Fiber supplement such as Benefiber- use as directed daily  If your blood pressure at your visit was 140/90 or greater, please contact your primary care physician to follow up on this.  _______________________________________________________  If you are age 30 or older, your body mass index should be between 23-30. Your Body mass index is 31.32 kg/m. If this is out of the aforementioned range listed, please consider follow up with your Primary Care Provider.  If you are age 33 or younger, your body mass index should be between 19-25. Your Body mass index is 31.32 kg/m. If this is out of the aformentioned range listed, please consider follow up with your Primary Care Provider.   ________________________________________________________  The [facility] providers would like to encourage you to use MYCHART to communicate with providers for non-urgent requests or questions.  Due to long hold times on the telephone, sending your provider a message by [facility] may be a faster and more efficient way to get a response.  Please allow 48 business hours for a response.  Please remember that this is for non-urgent requests.   Due to recent changes in healthcare laws, you may see the results of your imaging and laboratory studies on MyChart before your provider has had a chance to review them.  We understand that in some cases there may be results that are confusing or concerning to you. Not all laboratory results come back in the same time frame and the provider may be waiting for multiple results in order to interpret others.  Please give us  48 hours in order for your provider to thoroughly review all the  results before contacting the office for clarification of your results.   Thank you for entrusting me with your care and for choosing [facility], Dr. Estefana Kidney

## 2023-06-07 ENCOUNTER — Ambulatory Visit: Payer: Medicaid Other | Admitting: Dermatology

## 2023-06-07 NOTE — Progress Notes (Unsigned)
   Follow-Up Visit   Subjective  Zachary Romero is a 53 y.o. male who presents for the following: Mohs of left helix  The following portions of the chart were reviewed this encounter and updated as appropriate: medications, allergies, medical history  Review of Systems:  No other skin or systemic complaints except as noted in HPI or Assessment and Plan.  Objective  Well appearing patient in no apparent distress; mood and affect are within normal limits.  A focused examination was performed of the following areas: Left helix Relevant physical exam findings are noted in the Assessment and Plan.     Assessment & Plan      No follow-ups on file.  I, Darice Smock, CMA, am acting as scribe for RUFUS CHRISTELLA HOLY, MD.   Documentation: I have reviewed the above documentation for accuracy and completeness, and I agree with the above.  RUFUS CHRISTELLA HOLY, MD

## 2023-06-07 NOTE — Patient Instructions (Signed)

## 2023-06-15 ENCOUNTER — Encounter: Payer: Self-pay | Admitting: Dermatology

## 2023-06-15 ENCOUNTER — Ambulatory Visit: Payer: Medicaid Other | Admitting: Dermatology

## 2023-06-15 VITALS — BP 125/78 | HR 89 | Temp 99.2°F

## 2023-06-15 DIAGNOSIS — C44229 Squamous cell carcinoma of skin of left ear and external auricular canal: Secondary | ICD-10-CM | POA: Diagnosis not present

## 2023-06-15 DIAGNOSIS — L579 Skin changes due to chronic exposure to nonionizing radiation, unspecified: Secondary | ICD-10-CM

## 2023-06-15 DIAGNOSIS — C4492 Squamous cell carcinoma of skin, unspecified: Secondary | ICD-10-CM

## 2023-06-15 DIAGNOSIS — L814 Other melanin hyperpigmentation: Secondary | ICD-10-CM

## 2023-06-15 MED ORDER — MUPIROCIN 2 % EX OINT
1.0000 | TOPICAL_OINTMENT | Freq: Two times a day (BID) | CUTANEOUS | 0 refills | Status: DC
Start: 2023-06-15 — End: 2023-06-29

## 2023-06-15 MED ORDER — OXYCODONE HCL 5 MG PO TABS
5.0000 mg | ORAL_TABLET | Freq: Four times a day (QID) | ORAL | 0 refills | Status: DC | PRN
Start: 2023-06-15 — End: 2023-06-29

## 2023-06-15 MED ORDER — GENTAMICIN SULFATE 0.1 % EX OINT
1.0000 | TOPICAL_OINTMENT | Freq: Two times a day (BID) | CUTANEOUS | 0 refills | Status: DC
Start: 2023-06-15 — End: 2023-06-29

## 2023-06-15 NOTE — Progress Notes (Signed)
Follow-Up Visit   Subjective  Zachary Romero is a 53 y.o. male who presents for the following: Mohs of a SCC on pt left helix.  The following portions of the chart were reviewed this encounter and updated as appropriate: medications, allergies, medical history  Review of Systems:  No other skin or systemic complaints except as noted in HPI or Assessment and Plan.  Objective  Well appearing patient in no apparent distress; mood and affect are within normal limits.  A focused examination was performed of the following areas:  Left helix  Relevant physical exam findings are noted in the Assessment and Plan.     Assessment & Plan   SQUAMOUS CELL CARCINOMA OF SKIN Left helix Mohs surgery  Consent obtained: written  Anticoagulation: Is the patient taking prescription anticoagulant and/or aspirin prescribed/recommended by a physician? No   Was the anticoagulation regimen changed prior to Mohs? No    Procedure Details: Timeout: pre-procedure verification complete Procedure Prep: patient was prepped and draped in usual sterile fashion Prep type: chlorhexidine Biopsy accession number: [id number] Biopsy lab: [facility] Date of biopsy: 05/11/2023 Frozen section biopsy performed: No   Specimen debulked: Yes   Pre-Op diagnosis: squamous cell carcinoma SCC subtype: well differentiated MohsAIQ Surgical site (if tumor spans multiple areas, please select predominant area): ear Surgery side: left Surgical site (from skin exam): Left helix Pre-operative length (cm): 2.2 Pre-operative width (cm): 1.2 Indications for Mohs surgery: anatomic location where tissue conservation is critical Previously treated? No    Micrographic Surgery Details: Post-operative length (cm): 3 Post-operative width (cm): 1.2 Number of Mohs stages: 2  Stage 1    Tumor features identified on Mohs section: squamous cell carcinoma    Depth of defect after stage: subcutaneous fat    Perineural  invasion: no perineural invasion  Stage 2    Tumor features identified on Mohs section: no tumor identified    Depth of defect after stage: cartilage  Patient tolerance of procedure: tolerated well, no immediate complications  Reconstruction: Was the defect reconstructed? Yes   Was reconstruction performed by the same Mohs surgeon? Yes   Setting of reconstruction: outpatient office When was reconstruction performed? same day Type of reconstruction: linear Linear reconstruction: complex Length of linear repair (cm): 5  Opioids: Did the patient receive a prescription for opioid/narcotic related to Mohs surgery? Yes   Indications for opioid/narcotics: patient required additional pain relief despite trial of non-opioid analgesia  Skin repair Complexity:  Complex Final length (cm):  5.2 Informed consent: discussed and consent obtained   Timeout: patient name, date of birth, surgical site, and procedure verified   Procedure prep:  Patient was prepped and draped in usual sterile fashion Prep type:  Chlorhexidine Anesthesia: the lesion was anesthetized in a standard fashion   Anesthetic:  1% lidocaine w/ epinephrine 1-100,000 buffered w/ 8.4% NaHCO3 Reason for type of repair: reduce tension to allow closure and preserve normal anatomy   Undermining: edges undermined   Subcutaneous layers (deep stitches):  Suture size:  5-0 Suture type: Vicryl (polyglactin 910) and Monocryl (poliglecaprone 25)   Stitches:  Buried vertical mattress Fine/surface layer approximation (top stitches):  Suture size:  5-0 Suture type: Prolene (polypropylene)   Stitches: simple running   Suture removal (days):  10 Hemostasis achieved with: suture, pressure and electrodesiccation Outcome: patient tolerated procedure well with no complications   Post-procedure details: sterile dressing applied and wound care instructions given   Dressing type: bacitracin and pressure dressing   Related Medications  oxyCODONE  (OXY IR/ROXICODONE) 5 MG immediate release tablet Take 1 tablet (5 mg total) by mouth every 6 (six) hours as needed for up to 8 doses for severe pain (pain score 7-10).   Return in about 2 weeks (around 06/29/2023).  Dominga Ferry, Surg Tech III, am acting as scribe for Gwenith Daily, MD.    06/15/2023  HISTORY OF PRESENT ILLNESS  Zachary Romero is seen in consultation at the request of Dr. 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 They note that the area has been present for about 3 years increasing in size with time and tenderness.  There is no history of previous treatment.  Reports no other new or changing lesions and has no other complaints today. He is accompanied by his partner.  Medications and allergies: see patient chart.  Review of systems: Reviewed 8 systems and notable for the above skin cancer.  All other systems reviewed are unremarkable/negative, unless noted in the HPI. Past medical history, surgical history, family history, social history were also reviewed and are noted in the chart/questionnaire.    PHYSICAL EXAMINATION  General: Well-appearing, in no acute distress, alert and oriented x 4. Vitals reviewed in chart (if available).   Skin: Exam reveals a 2.2 x 1.2 cm erythematous papule and biopsy scar on the left helix. There are rhytids, telangiectasias, and lentigines, consistent with photodamage.   Biopsy report(s) reviewed, confirming the diagnosis.   ASSESSMENT  1) Well Differentiated Squamous Cell Carcinoma (upstaged to Moderately to Poorly Differentiated SCC upon Mohs layer reading) 2) photodamage 3) solar lentigines   PLAN   1. Due to location, size, histology, or recurrence and the likelihood of subclinical extension as well as the need to conserve normal surrounding tissue, the patient was deemed acceptable for Mohs micrographic surgery (MMS).  The nature and purpose of the procedure, associated benefits and  risks including recurrence and scarring, possible complications such as pain, infection, and bleeding, and alternative methods of treatment if appropriate were discussed with the patient during consent. The lesion location was verified by the patient, by reviewing previous notes, pathology reports, and by photographs as well as angulation measurements if available.  Informed consent was reviewed and signed by the patient, and timeout was performed at 9:30 AM. See op note below.  2. For the photodamage and solar lentigines, sun protection discussed/information given on OTC sunscreens, and we recommend continued regular follow-up with primary dermatologist every 6 months or sooner for any growing, bleeding, or changing lesions. 3. Prognosis and future surveillance discussed. 4. Letter with treatment outcome sent to referring provider. 5. Pain acetaminophen/ibuprofen/oxycodone 5 mg   MOHS MICROGRAPHIC SURGERY AND RECONSTRUCTION  Initial size:   2.2 x 1.2 cm Surgical defect/wound size: 3.0 x 1.2 cm Anesthesia:    0.33% lidocaine with 1:200,000 epinephrine EBL:    <5 mL Complications:  None Repair type:   Complex (Wedge Repair) SQ suture:   5-0 Monocryl and 4-0 Vicryl Cutaneous suture:  5-0 Polyprolene Final size of the repair: 5.2 cm  Stages: 2  STAGE I: Anesthesia achieved with 0.5% lidocaine with 1:200,000 epinephrine. ChloraPrep applied. 2 section(s) excised using Mohs technique (this includes total peripheral and deep tissue margin excision and evaluation with frozen sections, excised and interpreted by the same physician). The tumor was first debulked and then excised with an approx. 2 mm margin.  Hemostasis was achieved with electrocautery as needed.  The specimen was then oriented, subdivided/relaxed, inked, and processed using  Mohs technique.    Frozen section analysis revealed a positive margin for loss of normal architectural features, with atypical keratinocytes extending into the  dermis. The cells show marked pleomorphism, with enlarged nuclei, prominent nucleoli, and abundant mitotic figures. The differentiation of the tumor is moderate to poor, with areas of keratinization present but not consistent throughout, and some regions displaying a more infiltrative growth pattern in the deep margin.    STAGE II: An additional 2 mm margin was excised.  Hemostasis was achieved with electrocautery as needed.  The specimen was then oriented, subdivided/relaxed, inked, and processed using Mohs technique. Evaluation of slides by the Mohs surgeon revealed clear tumor margins.  Reconstruction  The surgical wound was then cleaned, prepped, and re-anesthetized as above. Wound edges were undermined extensively along at least one entire edge and at a distance equal to or greater than the width of the defect (see wound defect size above) in order to achieve closure and decrease wound tension and anatomic distortion. Redundant tissue repair including standing cone removal was performed. Hemostasis was achieved with electrocautery. Subcutaneous and epidermal tissues were approximated with the above sutures. The surgical site was then lightly scrubbed with sterile, saline-soaked gauze. The area was then bandaged using Vaseline ointment, non-adherent gauze, gauze pads, and tape to provide an adequate pressure dressing. The patient tolerated the procedure well, was given detailed written and verbal wound care instructions, and was discharged in good condition.   - oxyCODONE (OXY IR/ROXICODONE) 5 MG immediate release tablet; Take 1 tablet (5 mg total) by mouth every 6 (six) hours as needed for up to 8 doses for severe pain (pain score 7-10).  Dispense: 8 tablet; Refill: 0 - mupirocin ointment (BACTROBAN) 2 %; Apply 1 Application topically 2 (two) times daily.  Dispense: 22 g; Refill: 0 - gentamicin ointment (GARAMYCIN) 0.1 %; Apply 1 Application topically 2 (two) times daily.  Dispense: 60 g; Refill:  0   The patient will follow-up: 2 weeks.   Documentation: I have reviewed the above documentation for accuracy and completeness, and I agree with the above.  Gwenith Daily, MD

## 2023-06-15 NOTE — Patient Instructions (Signed)

## 2023-06-20 ENCOUNTER — Encounter: Payer: Self-pay | Admitting: Dermatology

## 2023-06-29 ENCOUNTER — Encounter: Payer: Self-pay | Admitting: Dermatology

## 2023-06-29 ENCOUNTER — Ambulatory Visit: Payer: Medicaid Other | Admitting: Internal Medicine

## 2023-06-29 ENCOUNTER — Ambulatory Visit: Payer: Medicaid Other | Admitting: Dermatology

## 2023-06-29 VITALS — BP 133/72 | HR 79 | Temp 98.4°F

## 2023-06-29 DIAGNOSIS — L539 Erythematous condition, unspecified: Secondary | ICD-10-CM

## 2023-06-29 DIAGNOSIS — K5900 Constipation, unspecified: Secondary | ICD-10-CM

## 2023-06-29 DIAGNOSIS — F1090 Alcohol use, unspecified, uncomplicated: Secondary | ICD-10-CM

## 2023-06-29 DIAGNOSIS — C4492 Squamous cell carcinoma of skin, unspecified: Secondary | ICD-10-CM

## 2023-06-29 DIAGNOSIS — L905 Scar conditions and fibrosis of skin: Secondary | ICD-10-CM

## 2023-06-29 DIAGNOSIS — Z789 Other specified health status: Secondary | ICD-10-CM

## 2023-06-29 DIAGNOSIS — Z85828 Personal history of other malignant neoplasm of skin: Secondary | ICD-10-CM | POA: Diagnosis not present

## 2023-06-29 DIAGNOSIS — M21372 Foot drop, left foot: Secondary | ICD-10-CM

## 2023-06-29 DIAGNOSIS — Z8589 Personal history of malignant neoplasm of other organs and systems: Secondary | ICD-10-CM | POA: Diagnosis not present

## 2023-06-29 MED ORDER — GABAPENTIN 300 MG PO CAPS: 300.0000 mg | ORAL_CAPSULE | Freq: Every day | ORAL | 2 refills | Status: DC

## 2023-06-29 NOTE — Patient Instructions (Addendum)
Thank you, Zachary Romero for allowing [place] to provide your care today.   Squamous cell carcinoma of your ear I am glad that you are following up with dermatology to get this addressed  Difficulty swallowing and incomplete emptying of bowels Please call your GI office today and see if they can add an endoscopy to your colonoscopy that is planned for February 3.  Also messaged your GI doctor.  Alcohol use You are at risk for liver scarring and other complications of chronic alcohol use that is high.  I am glad that you are starting gabapentin today to help with your cravings.  If this medication does not help with your cravings there are others.  I chose this 1 as it can also help with your nerve pain in your left lower extremity.  If you notice withdrawal symptoms with decreasing amount that you are drinking we can do a taper of medications to help you stop drinking alcohol if you are interested in that.  Foot drop I think it would be reasonable for you to go back to neurosurgery with Dr. Dutch Quint please call billing and see if you could potentially do a payment plan so that they would see you again.  If they are not open to this then I can refer you to a neurosurgeon at [facility].  For your foot drop I do think that you should go to occupational therapy at this time as they have devices that could help with your walking.  Blood pressure Your blood pressure looked better when we rechecked it.  Referrals ordered today:   Referral Orders         Ambulatory referral to Occupational Therapy       I have ordered the following medication/changed the following medications:   Stop the following medications: Medications Discontinued During This Encounter  Medication Reason   gentamicin ointment (GARAMYCIN) 0.1 %    mupirocin ointment (BACTROBAN) 2 %    cyclobenzaprine (FLEXERIL) 10 MG tablet    oxyCODONE (OXY IR/ROXICODONE) 5 MG immediate release tablet    ibuprofen (ADVIL,MOTRIN) 800 MG  tablet      Start the following medications: Meds ordered this encounter  Medications   gabapentin (NEURONTIN) 300 MG capsule    Sig: Take 1 capsule (300 mg total) by mouth at bedtime.    Dispense:  30 capsule    Refill:  2     Follow up:  1 month    We look forward to seeing you next time. Please call our clinic at [phone] if you have any questions or concerns. The best time to call is Monday-Friday from 9am-4pm, but there is someone available 24/7. If after hours or the weekend, call the main hospital number and ask for the Internal Medicine Resident On-Call. If you need medication refills, please notify your pharmacy one week in advance and they will send [place] a request.   Thank you for trusting me with your care. Wishing you the best!   Rudene Christians, DO [facility]
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
## 2023-06-29 NOTE — Patient Instructions (Signed)
Important Information   Due to recent changes in healthcare laws, you may see results of your pathology and/or laboratory studies on MyChart before the doctors have had a chance to review them. We understand that in some cases there may be results that are confusing or concerning to you. Please understand that not all results are received at the same time and often the doctors may need to interpret multiple results in order to provide you with the best plan of care or course of treatment. Therefore, we ask that you please give [place] 2 business days to thoroughly review all your results before contacting the office for clarification. Should we see a critical lab result, you will be contacted sooner.     If You Need Anything After Your Visit   If you have any questions or concerns for your doctor, please call our main line at [phone]. If no one answers, please leave a voicemail as directed and we will return your call as soon as possible. Messages left after 4 pm will be answered the following business day.    You may also send [place] a message via MyChart. We typically respond to MyChart messages within 1-2 business days.  For prescription refills, please ask your pharmacy to contact our office. Our fax number is [phone].  If you have an urgent issue when the clinic is closed that cannot wait until the next business day, you can page your doctor at the number below.     Please note that while we do our best to be available for urgent issues outside of office hours, we are not available 24/7.    If you have an urgent issue and are unable to reach [place], you may choose to seek medical care at your doctor's office, retail clinic, urgent care center, or emergency room.   If you have a medical emergency, please immediately call 911 or go to the emergency department. In the event of inclement weather, please call our main line at [phone] for an update on the status of any delays or  closures.  Dermatology Medication Tips: Please keep the boxes that topical medications come in in order to help keep track of the instructions about where and how to use these. Pharmacies typically print the medication instructions only on the boxes and not directly on the medication tubes.   If your medication is too expensive, please contact our office at [phone] or send [place] a message through MyChart.    We are unable to tell what your co-pay for medications will be in advance as this is different depending on your insurance coverage. However, we may be able to find a substitute medication at lower cost or fill out paperwork to get insurance to cover a needed medication.    If a prior authorization is required to get your medication covered by your insurance company, please allow [place] 1-2 business days to complete this process.   Drug prices often vary depending on where the prescription is filled and some pharmacies may offer cheaper prices.   The website www.goodrx.com contains coupons for medications through different pharmacies. The prices here do not account for what the cost may be with help from insurance (it may be cheaper with your insurance), but the website can give you the price if you did not use any insurance.  - You can print the associated coupon and take it with your prescription to the pharmacy.  - You may also stop by our office during regular  business hours and pick up a GoodRx coupon card.  - If you need your prescription sent electronically to a different pharmacy, notify our office through [facility] or by phone at [phone]

## 2023-06-29 NOTE — Progress Notes (Addendum)
   Follow Up Visit   Subjective  Zachary Romero is a 53 y.o. male who presents for the following: follow up from Mohs surgery for an SCC on the left helix.  The patient presents for follow up from Mohs surgery for a SCC on the left helix, treated on 06/15/23, repaired with wedge repair. The patient has been bandaging the wound as directed. The endorse the following concerns: mild redness.  The following portions of the chart were reviewed this encounter and updated as appropriate: medications, allergies, medical history  Review of Systems:  No other skin or systemic complaints except as noted in HPI or Assessment and Plan.  Objective  Well appearing patient in no apparent distress; mood and affect are within normal limits.  A full examination was performed including scalp, head, face and left ear. All findings within normal limits unless otherwise noted below.  Healing wound with mild erythema  Relevant physical exam findings are noted in the Assessment and Plan.     Assessment & Plan    Healing s/p Mohs for SCC, treated on left helix, repaired with linear closure/wedge repair - Reassured that wound is healing well - No evidence of infection - No swelling, induration, purulence, dehiscence, or tenderness out of proportion to the clinical exam, see photo above - Discussed that scars take up to 12 months to mature from the date of surgery - Recommend SPF 30+ to scar daily to prevent purple color from UV exposure during scar maturation process - Discussed that erythema and raised appearance of scar will fade over the next 4-6 months - OK to start scar massage at 4-6 weeks post-op - Can consider silicone based products for scar healing starting at 6 weeks post-op - Ok to continue ointment daily to wound under a bandage for another couple of weeks'  History of Squamous Cell Carcinoma of the left helix A CT of the head and neck for staging of squamous cell carcinoma (SCC) is not  strongly recommended at this time based on the [facility] and [facility]) criteria for imaging. According to these guidelines, imaging is typically indicated when a patient meets at least 2-3 of the following 4 high-risk criteria: (1) tumor diameter >=2 cm, (2) perineural invasion, (3) poor differentiation, and (4) deep invasion beyond the subcutaneous fat. At present, the patient does not meet sufficient criteria to strongly justify advanced imaging for staging. However, clinical monitoring will continue, and imaging may be reconsidered if there are concerning changes such as rapid growth, neurological symptoms, or deep tissue involvement. The patient was counseled on these recommendations, and all questions were addressed. Discussed that we could order CT, but insurance may not pay for as he does not meet Stage T2 criteria.  Return if symptoms worsen or fail to improve.  I, Tillie Fantasia, CMA, am acting as scribe for Gwenith Daily, MD.   Documentation: I have reviewed the above documentation for accuracy and completeness, and I agree with the above.  Gwenith Daily, MD

## 2023-06-30 ENCOUNTER — Encounter: Payer: Self-pay | Admitting: Internal Medicine

## 2023-06-30 DIAGNOSIS — K5900 Constipation, unspecified: Secondary | ICD-10-CM | POA: Insufficient documentation

## 2023-06-30 DIAGNOSIS — Z8589 Personal history of malignant neoplasm of other organs and systems: Secondary | ICD-10-CM | POA: Insufficient documentation

## 2023-06-30 DIAGNOSIS — M21372 Foot drop, left foot: Secondary | ICD-10-CM | POA: Insufficient documentation

## 2023-06-30 NOTE — Assessment & Plan Note (Addendum)
He continues to drink 8 beers versus 1/5 of hard liquor daily.  He is trying to cut down on this.  He has had withdrawal symptoms in the past.  No prior seizure. P: He is not ready to completely quit drinking.  He would like something to help reduce his cravings. Start gabapentin 300 mg nightly.  This medication selected in setting of neuropathic pain in his left lower extremity.  Follow-up in 4 weeks.  If at follow-up alcohol cravings have not improved would consider switching to first-line medication such as naltrexone or acamprosate Declines PHQ-9 and GAD screening at this visit

## 2023-06-30 NOTE — Assessment & Plan Note (Signed)
Patient establish care with GI on January 7 for change in stool pattern.  He does not have constipation all the time but feels like he is not able to fully void and has to go to the bathroom frequently a second time.  He has colonoscopy scheduled for next week.  Also has concerns about dysphagia that he feels like is worsening.  He had a history of tracheostomy following trauma several years ago and has had some chronic dysphagia but feels like there is a change.  Review of his GI note he actually declined EGD on January 7.  He would be interested in EGD at this time. P: I messaged Dr. Leonides Schanz, she will call patient and clarify if he wants EGD.  She does have time on her schedule for that day.

## 2023-06-30 NOTE — Assessment & Plan Note (Signed)
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  He followed up with dermatology on January 30.  They said that his scar is healing well and he does not need additional interventions for his carcinoma.

## 2023-06-30 NOTE — Assessment & Plan Note (Signed)
History of left-sided L4/5 laminectomy and discectomy in 2022 secondary to ruptured disc.  In surgery he has had some numbness and tingling.  He has had left foot drop since his surgery several years ago.  Symptoms are not worse but he does feel like he is tired of having to compensate.  He does want to follow-up with neurosurgery however he has an outstanding bill that prohibits him from doing so. Plan: I encouraged him to call neurosurgery office and see if he can set up a payment plan so that he can be seen by them again. Referral to therapy for foot drop.  I talked with him that they have some tools that can help him compensate Tory his hip does not hurt all the time.

## 2023-07-02 ENCOUNTER — Encounter: Payer: Self-pay | Admitting: Student

## 2023-07-03 ENCOUNTER — Ambulatory Visit: Payer: Medicaid Other | Admitting: Internal Medicine

## 2023-07-03 ENCOUNTER — Telehealth: Payer: Self-pay | Admitting: *Deleted

## 2023-07-03 ENCOUNTER — Encounter: Payer: Self-pay | Admitting: Internal Medicine

## 2023-07-03 VITALS — BP 114/69 | HR 67 | Temp 98.3°F | Resp 11 | Ht 79.0 in | Wt 278.0 lb

## 2023-07-03 DIAGNOSIS — K2282 Esophagogastric junction polyp: Secondary | ICD-10-CM

## 2023-07-03 DIAGNOSIS — R1310 Dysphagia, unspecified: Secondary | ICD-10-CM | POA: Diagnosis not present

## 2023-07-03 DIAGNOSIS — K648 Other hemorrhoids: Secondary | ICD-10-CM

## 2023-07-03 DIAGNOSIS — R197 Diarrhea, unspecified: Secondary | ICD-10-CM

## 2023-07-03 DIAGNOSIS — Z1211 Encounter for screening for malignant neoplasm of colon: Secondary | ICD-10-CM

## 2023-07-03 DIAGNOSIS — K2100 Gastro-esophageal reflux disease with esophagitis, without bleeding: Secondary | ICD-10-CM

## 2023-07-03 DIAGNOSIS — K2210 Ulcer of esophagus without bleeding: Secondary | ICD-10-CM | POA: Diagnosis not present

## 2023-07-03 DIAGNOSIS — D122 Benign neoplasm of ascending colon: Secondary | ICD-10-CM

## 2023-07-03 DIAGNOSIS — K2970 Gastritis, unspecified, without bleeding: Secondary | ICD-10-CM | POA: Diagnosis not present

## 2023-07-03 MED ORDER — SODIUM CHLORIDE 0.9 % IV SOLN: 500.0000 mL | INTRAVENOUS | Status: DC

## 2023-07-03 MED ORDER — PANTOPRAZOLE SODIUM 40 MG PO TBEC: 40.0000 mg | DELAYED_RELEASE_TABLET | Freq: Two times a day (BID) | ORAL | 2 refills | Status: DC

## 2023-07-03 NOTE — Patient Instructions (Signed)
Handouts Provided:  Gastritis and Polyps  Use Protonix 40 mg twice daily for 12 weeks.  Medication has been sent to your preferred pharmacy.  Please see scheduled upper Endoscopy.  Instructions provided.  YOU HAD AN ENDOSCOPIC PROCEDURE TODAY AT THE [facility]:   Refer to the procedure report that was given to you for any specific questions about what was found during the examination.  If the procedure report does not answer your questions, please call your gastroenterologist to clarify.  If you requested that your care partner not be given the details of your procedure findings, then the procedure report has been included in a sealed envelope for you to review at your convenience later.  YOU SHOULD EXPECT: Some feelings of bloating in the abdomen. Passage of more gas than usual.  Walking can help get rid of the air that was put into your GI tract during the procedure and reduce the bloating. If you had a lower endoscopy (such as a colonoscopy or flexible sigmoidoscopy) you may notice spotting of blood in your stool or on the toilet paper. If you underwent a bowel prep for your procedure, you may not have a normal bowel movement for a few days.  Please Note:  You might notice some irritation and congestion in your nose or some drainage.  This is from the oxygen used during your procedure.  There is no need for concern and it should clear up in a day or so.  SYMPTOMS TO REPORT IMMEDIATELY:  Following lower endoscopy (colonoscopy or flexible sigmoidoscopy):  Excessive amounts of blood in the stool  Significant tenderness or worsening of abdominal pains  Swelling of the abdomen that is new, acute  Fever of 100F or higher  Following upper endoscopy (EGD)  Vomiting of blood or coffee ground material  New chest pain or pain under the shoulder blades  Painful or persistently difficult swallowing  New shortness of breath  Fever of 100F or higher  Black, tarry-looking stools  For  urgent or emergent issues, a gastroenterologist can be reached at any hour by calling [phone]. Do not use MyChart messaging for urgent concerns.    DIET:  We do recommend a small meal at first, but then you may proceed to your regular diet.  Drink plenty of fluids but you should avoid alcoholic beverages for 24 hours.  ACTIVITY:  You should plan to take it easy for the rest of today and you should NOT DRIVE or use heavy machinery until tomorrow (because of the sedation medicines used during the test).    FOLLOW UP: Our staff will call the number listed on your records the next business day following your procedure.  We will call around 7:15- 8:00 am to check on you and address any questions or concerns that you may have regarding the information given to you following your procedure. If we do not reach you, we will leave a message.     If any biopsies were taken you will be contacted by phone or by letter within the next 1-3 weeks.  Please call us at [phone] if you have not heard about the biopsies in 3 weeks.    SIGNATURES/CONFIDENTIALITY: You and/or your care partner have signed paperwork which will be entered into your electronic medical record.  These signatures attest to the fact that that the information above on your After Visit Summary has been reviewed and is understood.  Full responsibility of the confidentiality of this discharge information lies with  you and/or your care-partner.

## 2023-07-03 NOTE — Progress Notes (Signed)
GASTROENTEROLOGY PROCEDURE H&P NOTE   Primary Care Physician: Rocky Morel, DO    Reason for Procedure:   Colon cancer screening, loose stools, dysphagia  Plan:    EGD/colonoscopy  Patient is appropriate for endoscopic procedure(s) in the ambulatory ([facility]) setting.  The nature of the procedure, as well as the risks, benefits, and alternatives were carefully and thoroughly reviewed with the patient. Ample time for discussion and questions allowed. The patient understood, was satisfied, and agreed to proceed.     HPI: Zachary Romero is a 53 y.o. male who presents for EGD/colonoscopy for evaluation of colon cancer screening, loose stools, dysphagia.  Patient was most recently seen in the Gastroenterology Clinic on 06/06/23.  No interval change in medical history since that appointment. Please refer to that note for full details regarding GI history and clinical presentation.   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 Authorizing Provider  gabapentin (NEURONTIN) 300 MG capsule Take 1 capsule (300 mg total) by mouth at bedtime. 06/29/23 06/28/24  Masters, Katie, DO  Multiple Vitamin  (MULTIVITAMIN WITH MINERALS) TABS tablet Take 1 tablet by mouth daily. 11/05/22   Pahwani, Kasandra Knudsen, MD  Na Sulfate-K Sulfate-Mg Sulf 17.5-3.13-1.6 GM/177ML SOLN Use as directed; may use generic; goodrx card if insurance will not cover generic 06/06/23   Imogene Burn, MD  sildenafil (VIAGRA) 50 MG tablet Take 1 tablet (50 mg total) by mouth daily as needed for erectile dysfunction. 04/20/23   Rocky Morel, DO  tadalafil (CIALIS) 10 MG tablet Take 1 tablet (10 mg total) by mouth as needed for erectile dysfunction. 03/17/23   Rocky Morel, DO    Current Outpatient Medications  Medication Sig Dispense Refill   gabapentin (NEURONTIN) 300 MG capsule Take 1 capsule (300 mg total) by mouth at bedtime. 30 capsule 2   Multiple Vitamin (MULTIVITAMIN WITH MINERALS) TABS tablet Take 1 tablet by mouth daily.     Na Sulfate-K Sulfate-Mg Sulf 17.5-3.13-1.6 GM/177ML SOLN Use as directed; may use generic; goodrx card if insurance will not cover generic 354 mL 0   sildenafil (VIAGRA) 50 MG tablet Take 1 tablet (50 mg total) by mouth daily as needed for erectile dysfunction. 30 tablet 3   tadalafil (CIALIS) 10 MG tablet Take 1 tablet (10 mg total) by mouth as needed for erectile dysfunction. 20 tablet 1   No current facility-administered medications for this visit.    Allergies as of 07/03/2023   (No Known Allergies)    Family History  Problem Relation Age of Onset   Ovarian cancer Mother    Liver disease Neg Hx  Esophageal cancer Neg Hx    Colon cancer Neg Hx     Social History   Socioeconomic History   Marital status: Significant Other    Spouse name: Not on file   Number of children: 2   Years of education: Not on file   Highest education level: Not on file  Occupational History   Occupation: unemployed.  Tobacco Use   Smoking status: Never   Smokeless tobacco: Never  Vaping Use   Vaping status: Never Used  Substance and Sexual Activity   Alcohol use: Yes    Alcohol/week: 5.0  standard drinks of alcohol    Types: 5 Cans of beer per week   Drug use: Yes    Types: Cocaine   Sexual activity: Not on file  Other Topics Concern   Not on file  Social History Narrative   Not on file   Social Drivers of Health   Financial Resource Strain: Not on file  Food Insecurity: High Risk (03/15/2023)   Received from [facility]   Hunger Vital Sign    Worried About Running Out of Food in the Last Year: Often true    Ran Out of Food in the Last Year: Often true  Transportation Needs: Unmet Transportation Needs (03/15/2023)   Received from [facility]    In the past 12 months, has lack of reliable transportation kept you from medical appointments, meetings, work or from getting things needed for daily living? : Yes  Physical Activity: Not on file  Stress: Not on file  Social Connections: Not on file  Intimate Partner Violence: Not At Risk (11/01/2022)   Humiliation, Afraid, Rape, and Kick questionnaire    Fear of Current or Ex-Partner: No    Emotionally Abused: No    Physically Abused: No    Sexually Abused: No    Physical Exam: Vital signs in last 24 hours: BP (!) 101/59   Pulse 72   Temp 98.3 F (36.8 C)   Ht 6\' 7"  (2.007 m)   Wt 278 lb (126.1 kg)   SpO2 96%   BMI 31.32 kg/m  GEN: NAD EYE: Sclerae anicteric ENT: MMM CV: Non-tachycardic Pulm: No increased WOB GI: Soft NEURO:  Alert & Oriented   Eulah Pont, MD [facility]   07/03/2023 10:06 AM

## 2023-07-03 NOTE — Telephone Encounter (Signed)
Instructions for EGD given to patient.

## 2023-07-03 NOTE — Op Note (Signed)
[facility] Patient Name: Zachary Zachary Procedure Date: 07/03/2023 10:34 AM MRN: [id number] Endoscopist: Madelyn Brunner [place] , , [id number] Age: 53 Referring MD:  Date of Birth: June 15, 1970 Gender: Male Account #: [id number] Procedure:                Colonoscopy Indications:              Screening for colorectal malignant neoplasm, This                            is the patient's first colonoscopy, Incidental -                            Diarrhea Medicines:                Monitored Anesthesia Care Procedure:                Pre-Anesthesia Assessment:                           - Prior to the procedure, a History and Physical                            was performed, and patient medications and                            allergies were reviewed. The patient's tolerance of                            previous anesthesia was also reviewed. The risks                            and benefits of the procedure and the sedation                            options and risks were discussed with the patient.                            All questions were answered, and informed consent                            was obtained. Prior Anticoagulants: The patient has                            taken no anticoagulant or antiplatelet agents. ASA                            Grade Assessment: II - A patient with mild systemic                            disease. After reviewing the risks and benefits,                            the patient was deemed in satisfactory condition to  undergo the procedure.                           After obtaining informed consent, the colonoscope                            was passed under direct vision. Throughout the                            procedure, the patient's blood pressure, pulse, and                            oxygen saturations were monitored continuously. The                            Olympus Scope SN [id number] was introduced through  the                            anus and advanced to the the terminal ileum. The                            colonoscopy was performed without difficulty. The                            patient tolerated the procedure well. The quality                            of the bowel preparation was adequate. The terminal                            ileum, ileocecal valve, appendiceal orifice, and                            rectum were photographed. Scope In: 11:12:02 AM Scope Out: 11:31:03 AM Scope Withdrawal Time: 0 hours 15 minutes 56 seconds  Total Procedure Duration: 0 hours 19 minutes 1 second  Findings:                 The terminal ileum appeared normal.                           A 4 mm polyp was found in the ascending colon. The                            polyp was sessile. The polyp was removed with a                            cold snare. Resection and retrieval were complete.                           Non-bleeding internal hemorrhoids were found during                            retroflexion.  Biopsies for histology were taken with a cold                            forceps from the entire colon for evaluation of                            microscopic colitis. Complications:            No immediate complications. Estimated Blood Loss:     Estimated blood loss was minimal. Impression:               - The examined portion of the ileum was normal.                           - One 4 mm polyp in the ascending colon, removed                            with a cold snare. Resected and retrieved.                           - Non-bleeding internal hemorrhoids.                           - Biopsies were taken with a cold forceps from the                            entire colon for evaluation of microscopic colitis. Recommendation:           - Discharge patient to home (with escort).                           - Await pathology results.                           - The findings  and recommendations were discussed                            with the patient. Dr Particia Lather "[place]" [place],  07/03/2023 11:37:21 AM

## 2023-07-03 NOTE — Progress Notes (Signed)
Pt states that he used cocaine "a few days ago".  Genelle Bal, CRNA made aware.

## 2023-07-03 NOTE — Op Note (Signed)
[facility] Patient Name: Zachary Romero Procedure Date: 07/03/2023 10:47 AM MRN: [id number] Endoscopist: Madelyn Brunner [place] , , [id number] Age: 53 Referring MD:  Date of Birth: 1970/11/07 Gender: Male Account #: [id number] Procedure:                Upper GI endoscopy Indications:              Dysphagia Medicines:                Monitored Anesthesia Care Procedure:                Pre-Anesthesia Assessment:                           - Prior to the procedure, a History and Physical                            was performed, and patient medications and                            allergies were reviewed. The patient's tolerance of                            previous anesthesia was also reviewed. The risks                            and benefits of the procedure and the sedation                            options and risks were discussed with the patient.                            All questions were answered, and informed consent                            was obtained. Prior Anticoagulants: The patient has                            taken no anticoagulant or antiplatelet agents. ASA                            Grade Assessment: II - A patient with mild systemic                            disease. After reviewing the risks and benefits,                            the patient was deemed in satisfactory condition to                            undergo the procedure.                           After obtaining informed consent, the endoscope was  passed under direct vision. Throughout the                            procedure, the patient's blood pressure, pulse, and                            oxygen saturations were monitored continuously. The                            GIF [id number] was introduced through the                            mouth, and advanced to the second part of duodenum.                            The upper GI endoscopy was accomplished  without                            difficulty. The patient tolerated the procedure                            well. Scope In: Scope Out: Findings:                 LA Grade C (one or more mucosal breaks continuous                            between tops of 2 or more mucosal folds, less than                            75% circumference) esophagitis with no bleeding was                            found in the distal esophagus. Biopsies were taken                            with a cold forceps for histology.                           A single 8 mm pedunculated polyp with no bleeding                            and no stigmata of recent bleeding was found at the                            gastroesophageal junction. Preparations were made                            for mucosal resection. A 0.1 mg/mL solution of                            epinephrine was injected to raise the lesion. Snare  mucosal resection was performed. Resection and                            retrieval were complete.                           Localized inflammation characterized by congestion                            (edema), erosions and erythema was found in the                            gastric antrum. Biopsies were taken with a cold                            forceps for histology.                           The examined duodenum was normal. Complications:            No immediate complications. Estimated Blood Loss:     Estimated blood loss was minimal. Impression:               - LA Grade C reflux esophagitis with no bleeding.                            Biopsied.                           - A single gastroesophageal junction polyp.                            Resected and retrieved.                           - Gastritis. Biopsied.                           - Normal examined duodenum.                           - Mucosal resection was performed. Resection and                             retrieval were complete. Recommendation:           - Await pathology results.                           - Use Protonix (pantoprazole) 40 mg PO BID for 12                            weeks.                           - Repeat upper endoscopy in 2 months to check  healing.                           - Perform a colonoscopy today. Dr Particia Lather "[place]" [place],  07/03/2023 11:35:25 AM

## 2023-07-03 NOTE — Progress Notes (Signed)
 Internal Medicine Clinic Attending  Case discussed with the resident at the time of the visit.  We reviewed the resident's history and exam and pertinent patient test results.  I agree with the assessment, diagnosis, and plan of care documented in the resident's note.

## 2023-07-04 ENCOUNTER — Telehealth: Payer: Self-pay | Admitting: *Deleted

## 2023-07-04 NOTE — Telephone Encounter (Signed)
  Follow up Call-     07/03/2023    9:52 AM  Call back number  Post procedure Call Back phone  # [phone]  Permission to leave phone message Yes     Patient questions:  Do you have a fever, pain , or abdominal swelling? No. Pain Score  0 *  Have you tolerated food without any problems? Yes.    Have you been able to return to your normal activities? Yes.    Do you have any questions about your discharge instructions: Diet   No. Medications  No. Follow up visit  No.  Do you have questions or concerns about your Care? No.  Actions: * If pain score is 4 or above: No action needed, pain <4.

## 2023-07-05 ENCOUNTER — Other Ambulatory Visit: Payer: Self-pay | Admitting: Dermatology

## 2023-07-05 DIAGNOSIS — C4492 Squamous cell carcinoma of skin, unspecified: Secondary | ICD-10-CM

## 2023-07-05 LAB — SURGICAL PATHOLOGY

## 2023-07-06 ENCOUNTER — Encounter: Payer: Self-pay | Admitting: Internal Medicine

## 2023-07-12 ENCOUNTER — Other Ambulatory Visit: Payer: Self-pay | Admitting: Dermatology

## 2023-07-13 ENCOUNTER — Ambulatory Visit (HOSPITAL_BASED_OUTPATIENT_CLINIC_OR_DEPARTMENT_OTHER)
Admission: RE | Admit: 2023-07-13 | Discharge: 2023-07-13 | Disposition: A | Discharge status: Home / Self Care | Payer: Medicaid Other | Source: Ambulatory Visit | Attending: Dermatology | Admitting: Dermatology

## 2023-07-13 DIAGNOSIS — C4492 Squamous cell carcinoma of skin, unspecified: Secondary | ICD-10-CM | POA: Insufficient documentation

## 2023-07-13 DIAGNOSIS — C4490 Unspecified malignant neoplasm of skin, unspecified: Secondary | ICD-10-CM | POA: Diagnosis not present

## 2023-07-13 MED ORDER — IOHEXOL 300 MG/ML  SOLN
100.0000 mL | Freq: Once | INTRAMUSCULAR | Status: AC | PRN
  Administered 2023-07-13: 75 mL via INTRAVENOUS

## 2023-07-14 ENCOUNTER — Encounter: Payer: Medicaid Other | Admitting: Student

## 2023-08-03 ENCOUNTER — Ambulatory Visit: Admitting: Dermatology

## 2023-08-03 ENCOUNTER — Encounter: Payer: Self-pay | Admitting: Dermatology

## 2023-08-03 VITALS — BP 140/75

## 2023-08-03 DIAGNOSIS — T85622A Displacement of permanent sutures, initial encounter: Secondary | ICD-10-CM

## 2023-08-03 DIAGNOSIS — Z85828 Personal history of other malignant neoplasm of skin: Secondary | ICD-10-CM | POA: Diagnosis not present

## 2023-08-03 DIAGNOSIS — L905 Scar conditions and fibrosis of skin: Secondary | ICD-10-CM

## 2023-08-03 DIAGNOSIS — T8131XA Disruption of external operation (surgical) wound, not elsewhere classified, initial encounter: Secondary | ICD-10-CM

## 2023-08-03 DIAGNOSIS — C4492 Squamous cell carcinoma of skin, unspecified: Secondary | ICD-10-CM

## 2023-08-03 NOTE — Progress Notes (Signed)
   Follow Up Visit   Subjective  Zachary Romero is a 53 y.o. male who presents for the following: follow up from Mohs surgery   The patient presents for follow up from Mohs surgery for a SCC on the left helix, treated on 06/29/23, repaired with linear closure/wedge repair. The patient has been bandaging the wound as directed. The endorse the following concerns: is it healing properly.  The following portions of the chart were reviewed this encounter and updated as appropriate: medications, allergies, medical history  Review of Systems:  No other skin or systemic complaints except as noted in HPI or Assessment and Plan.  Objective  Well appearing patient in no apparent distress; mood and affect are within normal limits.  A full examination was performed including scalp, head, face and ear. All findings within normal limits unless otherwise noted below.  Healing wound with mild erythema  Relevant physical exam findings are noted in the Assessment and Plan.    Assessment & Plan   Healing s/p Mohs for SCC, treated on left helix, repaired with wedge repair. - Reassured that wound is healing well - Spitting sutures removed today - No evidence of infection - No swelling, induration, purulence, dehiscence, or tenderness out of proportion to the clinical exam, see photo above - Discussed that scars take up to 12 months to mature from the date of surgery - Recommend SPF 30+ to scar daily to prevent purple color from UV exposure during scar maturation process - Discussed that erythema and raised appearance of scar will fade over the next 4-6 months - OK to start scar massage at 4-6 weeks post-op - Can consider silicone based products for scar healing starting at 6 weeks post-op - Ok to discontinue ointment daily to wound. - CT scan imaging negative for metastatic disease  HISTORY OF SQUAMOUS CELL CARCINOMA OF THE SKIN - No evidence of recurrence today - No lymphadenopathy - Recommend  regular full body skin exams - Recommend daily broad spectrum sunscreen SPF 30+ to sun-exposed areas, reapply every 2 hours as needed.  - Call if any new or changing lesions are noted between office visits  Return in 6 months (on 02/03/2024), or if symptoms worsen or fail to improve, for TBSE.  I, Manual Meier, Surg Tech III, am acting as scribe for Gwenith Daily, MD.   Documentation: I have reviewed the above documentation for accuracy and completeness, and I agree with the above.  Gwenith Daily, MD

## 2023-08-03 NOTE — Patient Instructions (Signed)

## 2023-08-31 ENCOUNTER — Encounter: Payer: Medicaid Other | Admitting: Internal Medicine

## 2024-02-15 ENCOUNTER — Telehealth: Payer: Self-pay

## 2024-02-15 NOTE — Telephone Encounter (Signed)
 Patient last seen 06/29/23 I called the patient to schedule a fu appointment. Unable to reach the patient.

## 2024-05-16 ENCOUNTER — Ambulatory Visit (INDEPENDENT_AMBULATORY_CARE_PROVIDER_SITE_OTHER): Payer: Self-pay | Admitting: Student

## 2024-05-16 VITALS — BP 115/73 | HR 74 | Temp 97.6°F | Ht 79.0 in | Wt 242.4 lb

## 2024-05-16 DIAGNOSIS — Z8589 Personal history of malignant neoplasm of other organs and systems: Secondary | ICD-10-CM

## 2024-05-16 DIAGNOSIS — M21372 Foot drop, left foot: Secondary | ICD-10-CM

## 2024-05-16 DIAGNOSIS — Z860101 Personal history of adenomatous and serrated colon polyps: Secondary | ICD-10-CM

## 2024-05-16 DIAGNOSIS — M5126 Other intervertebral disc displacement, lumbar region: Secondary | ICD-10-CM

## 2024-05-16 DIAGNOSIS — F1090 Alcohol use, unspecified, uncomplicated: Secondary | ICD-10-CM

## 2024-05-16 DIAGNOSIS — K5900 Constipation, unspecified: Secondary | ICD-10-CM

## 2024-05-16 DIAGNOSIS — R7303 Prediabetes: Secondary | ICD-10-CM

## 2024-05-16 DIAGNOSIS — E7800 Pure hypercholesterolemia, unspecified: Secondary | ICD-10-CM

## 2024-05-16 DIAGNOSIS — K2960 Other gastritis without bleeding: Secondary | ICD-10-CM

## 2024-05-16 LAB — GLUCOSE, CAPILLARY: Glucose-Capillary: 110 mg/dL — ABNORMAL HIGH (ref 70–99)

## 2024-05-16 LAB — POCT GLYCOSYLATED HEMOGLOBIN (HGB A1C): HbA1c, POC (prediabetic range): 5.4 % — AB (ref 5.7–6.4)

## 2024-05-16 NOTE — Progress Notes (Signed)
 CC: Routine Follow Up for management of chronic medical conditions after last office visit 06/29/2023  HPI:  Zachary Romero is a 53 y.o. 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 Please see assessment and plan below for further details.  Medications: No current outpatient medications    Review of Systems:   Pertinent items noted in HPI and/or A&P.  Physical Exam:  Vitals:   05/16/24 1321  BP: 115/73  Pulse: 74  Temp: 97.6 F (36.4 C)  TempSrc: Oral  SpO2: 97%  Weight: 242 lb 6.4 oz (110 kg)  Height: 6' 7 (2.007 m)    Constitutional: Well-appearing adult male. In no acute distress. HEENT: Normocephalic, atraumatic, Sclera non-icteric, PERRL, EOM intact Cardio:Regular rate and rhythm. 2+ bilateral radial and dorsalis pedis  pulses. Pulm:Clear to auscultation bilaterally. Normal work of breathing on room air. MSK: Trace lower extremity edema bilaterally with some varicose veins present. Skin:Warm and dry. Neuro:Alert and oriented x3.  Left foot drop Psych:Pleasant mood and affect.   Assessment & Plan:   Assessment & Plan Herniated nucleus pulposus, L4-5 left Left foot drop Prior left-sided L4/5 laminectomy and discectomy in 2022 for herniated nucleus pulposus.  He has had some persistent but mild sciatic nerve pain on the left side and left foot drop since then.  Plan was previously to have him see occupational therapy and physical therapy as well as get an orthotic for his foot drop.  Unfortunately he was incarcerated from 09/17/2023 - 05/18/2024.  We will resume this plan today.  He is worried about any new or worsening herniation although denies any new or worsening symptoms or any injuries.  He is well overdue for  follow-up at the neurosurgery office previously due to some outstanding bills and he will call the billing office to try to work out a payment plan and return to their clinic.  Due to his significant mobility issue he plans on pursuing disability and will do so through the Social Security office.  Discussed gabapentin  for his sciatic pain but he says it is manageable and does not want to take any medicines for it at this time. - Follow-up with neurosurgery - PT/OT referral and DME referral for foot drop orthotic Pre-diabetes A1c today 5.4 which is improved from 6.2 a year ago. - Repeat A1c in 1 year Pure hypercholesterolemia Last lipid profile from 11/17/2022 shows total cholesterol of 214 and LDL of 142 with a current ASCVD risk of 4.1% over 10 years.  Will repeat a lipid panel today. - Lipid panel Alcohol use disorder Previously he had a long history of alcohol use disorder but after being incarcerated from April to December of 2025 he no longer is using alcohol.  He denies any cravings and does not want to start a medicine at this time for cravings.  He is going to [employer] and will continue to do so.  He will let us  know if he does have any cravings and we could start a medication like naltrexone. Constipation, unspecified constipation type Erosive gastritis Prior to being incarcerated in April 2025 he underwent colonoscopy and EGD for longstanding constipation and reflux pain.  He was found to have erosive gastritis and esophagitis without H. pylori or signs of viral cytopathology or dysplasia on biopsies.  Colonoscopy showed  1 tubular adenoma but otherwise random colonic biopsies showed normal mucosa.  Since he has stopped drinking alcohol and improved his fiber intake he no longer has these issues.  I recommend that he calls the gastroenterology office to schedule follow-up as they may want to do a repeat EGD at some point. History of squamous cell carcinoma Treated for well differentiated  squamous cell carcinoma of his left helix with Mohs surgery and follow-up with dermatology in March 2025 after undergoing CT of the head and neck soft tissues.  There was no visual evidence of recurrence and no evidence of metastatic disease on CT scans.  Dermatologist did recommend that he follow-up in about 6 months which would have been September so he will call the dermatology office to set up follow-up.  Orders Placed This Encounter  Procedures   Lipid panel   Glucose, capillary   Ambulatory Referral for DME    Referral Priority:   Routine    Referral Type:   Durable Medical Equipment Purchase    Number of Visits Requested:   1   Ambulatory referral to Physical Therapy    Referral Priority:   Routine    Referral Type:   Physical Medicine    Referral Reason:   Specialty Services Required    Requested Specialty:   Physical Therapy    Number of Visits Requested:   1   Ambulatory referral to Occupational Therapy    Referral Priority:   Routine    Referral Type:   Occupational Therapy    Referral Reason:   Specialty Services Required    Requested Specialty:   Occupational Therapy    Number of Visits Requested:   1   POCT glycosylated hemoglobin (Hb A1C)     Return in about 3 months (around 08/14/2024) for Routine Check Up w/ PCP.   Patient discussed with Dr. Layman Bee  Fairy Pool, DO [facility] Internal [facility] Resident PGY-3 Clinic Phone: [phone] Please contact the on call pager at [phone] for any urgent or emergent needs.

## 2024-05-16 NOTE — Patient Instructions (Addendum)
 Thank you, [name], for allowing us  to provide your care today. Today we discussed . . .  > Back pain and foot drop       - I would like to refer you to physical therapy and Occupational Therapy to work on exercises and to get an orthotic to help with your foot drop.  I would also like you to contact the neurosurgery office to see if you are able to follow back up with them.  I think that Dr. Malcolm can answer the question about follow-up scans. > Disability       - I would recommend looking at the [employer] or going to the office building to start the application for disability and there should be some resources available to find a disability doctor within the [place] area.  If there is anything that our office can do to help please let us  know.  I would like you to call the dermatologist and gastroenterologist office office as well to get a follow-up.  Based on their last note the dermatologist wanted to see you back around September of this year so please call their office to schedule follow-up appointment.  I would like to see you back in about 3 months to check on your progress but please let us  know if there is anything you need before then.  I have ordered the following labs for you:   Lab Orders         Lipid panel         POCT glycosylated hemoglobin (Hb A1C)       Referrals ordered today:    Referral Orders         Ambulatory Referral for DME         Ambulatory referral to Physical Therapy         Ambulatory referral to Occupational Therapy       Follow up: 3 months    Remember:  Should you have any questions or concerns please call the internal medicine clinic at [phone].     Fairy Pool, DO [facility]

## 2024-05-16 NOTE — Progress Notes (Signed)
 Internal Medicine Clinic Attending  Case discussed with the resident at the time of the visit.  We reviewed the resident's history and exam and pertinent patient test results.  I agree with the assessment, diagnosis, and plan of care documented in the resident's note.

## 2024-05-16 NOTE — Assessment & Plan Note (Signed)
 Prior left-sided L4/5 laminectomy and discectomy in 2022 for herniated nucleus pulposus.  He has had some persistent but mild sciatic nerve pain on the left side and left foot drop since then.  Plan was previously to have him see occupational therapy and physical therapy as well as get an orthotic for his foot drop.  Unfortunately he was incarcerated from 09/17/2023 - 05/18/2024.  We will resume this plan today.  He is worried about any new or worsening herniation although denies any new or worsening symptoms or any injuries.  He is well overdue for follow-up at the neurosurgery office previously due to some outstanding bills and he will call the billing office to try to work out a payment plan and return to their clinic.  Due to his significant mobility issue he plans on pursuing disability and will do so through the Social Security office.  Discussed gabapentin  for his sciatic pain but he says it is manageable and does not want to take any medicines for it at this time. - Follow-up with neurosurgery - PT/OT referral and DME referral for foot drop orthotic

## 2024-05-16 NOTE — Assessment & Plan Note (Signed)
 Last lipid profile from 11/17/2022 shows total cholesterol of 214 and LDL of 142 with a current ASCVD risk of 4.1% over 10 years.  Will repeat a lipid panel today. - Lipid panel

## 2024-05-16 NOTE — Assessment & Plan Note (Signed)
 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  There was no visual evidence of recurrence and no evidence of metastatic disease on CT scans.  Dermatologist did recommend that he follow-up in about 6 months which would have been September so he will call the dermatology office to set up follow-up.

## 2024-05-16 NOTE — Assessment & Plan Note (Signed)
 Prior to being incarcerated in April 2025 he underwent colonoscopy and EGD for longstanding constipation and reflux pain.  He was found to have erosive gastritis and esophagitis without H. pylori or signs of viral cytopathology or dysplasia on biopsies.  Colonoscopy showed 1 tubular adenoma but otherwise random colonic biopsies showed normal mucosa.  Since he has stopped drinking alcohol and improved his fiber intake he no longer has these issues.  I recommend that he calls the gastroenterology office to schedule follow-up as they may want to do a repeat EGD at some point.

## 2024-05-16 NOTE — Assessment & Plan Note (Signed)
 A1c today 5.4 which is improved from 6.2 a year ago. - Repeat A1c in 1 year

## 2024-05-17 ENCOUNTER — Ambulatory Visit: Payer: Self-pay | Admitting: Student

## 2024-05-17 LAB — LIPID PANEL
Chol/HDL Ratio: 4.9 ratio (ref 0.0–5.0)
Cholesterol, Total: 187 mg/dL (ref 100–199)
HDL: 38 mg/dL — ABNORMAL LOW
LDL Chol Calc (NIH): 124 mg/dL — ABNORMAL HIGH (ref 0–99)
Triglycerides: 139 mg/dL (ref 0–149)
VLDL Cholesterol Cal: 25 mg/dL (ref 5–40)

## 2024-05-17 NOTE — Progress Notes (Signed)
 LDL slightly lower than prior at 124 and ASCVD risk of 3.8%.  We will continue to monitor lipid panel yearly.

## 2024-06-20 ENCOUNTER — Ambulatory Visit: Payer: Self-pay | Admitting: Student

## 2024-06-21 ENCOUNTER — Ambulatory Visit: Payer: Self-pay

## 2024-06-21 VITALS — BP 122/77 | HR 71 | Temp 98.7°F | Wt 257.4 lb

## 2024-06-21 DIAGNOSIS — Z7189 Other specified counseling: Secondary | ICD-10-CM

## 2024-06-21 DIAGNOSIS — F1090 Alcohol use, unspecified, uncomplicated: Secondary | ICD-10-CM

## 2024-06-21 DIAGNOSIS — Z0000 Encounter for general adult medical examination without abnormal findings: Secondary | ICD-10-CM

## 2024-06-21 DIAGNOSIS — N529 Male erectile dysfunction, unspecified: Secondary | ICD-10-CM

## 2024-06-21 DIAGNOSIS — M5126 Other intervertebral disc displacement, lumbar region: Secondary | ICD-10-CM

## 2024-06-21 MED ORDER — SILDENAFIL CITRATE 100 MG PO TABS: 100.0000 mg | ORAL_TABLET | ORAL | 1 refills | Status: AC | PRN

## 2024-06-21 MED ORDER — GABAPENTIN 300 MG PO CAPS: 300.0000 mg | ORAL_CAPSULE | Freq: Two times a day (BID) | ORAL | 0 refills | Status: AC | PRN

## 2024-06-21 NOTE — Assessment & Plan Note (Signed)
 Had laminectomy and discectomy in 2022 with Dr. Malcolm. At LOV in 04/2024, recommended for him to follow up with [facility] as he has not seen them since the surgery. Today he is having worsening left sided back pain with sciatic-like pain down his leg with symptoms of persistent foot drop. He has not called [facility] and reiterates that he has outstanding bills with them. He has previously had Medicaid but does not currently. At LOV was referred to PT/OT and for orthotics, he has followed up with both and is going to the [facility] after this appointment. Discussed the importance of re-establishing with [facility]. Will place referral today and help him with pain management in the interim. He has been taking NSAIDs since discharged from prison on 12/20. I recommended he alternative between NSAIDs and acetaminophen , additionally will prescribe gabapentin . Counseled on side effects such as drowsiness and fatigue. I also urged him to stop lifting weights at the gym.  Plan NS referral  Naproxen  in AM, acetaminophen  1000 mg in afternoon and another 1000 mg at bedtime  Gabapentin  300 mg BID PRN Orders:   Ambulatory referral to Neurosurgery   gabapentin  (NEURONTIN ) 300 MG capsule; Take 1 capsule (300 mg total) by mouth 2 (two) times daily as needed.

## 2024-06-21 NOTE — Assessment & Plan Note (Signed)
 Had been abstinent since admitted to prison on 09/17/2023. He says that he went to AA meeting this morning. Encouraged him to continue with alcohol abstinence as this will improve multiple aspects of his health.  Plan Continue AA

## 2024-06-21 NOTE — Patient Instructions (Addendum)
 It was wonderful seeing you today!   1) Be on the lookout for someone to call you about neurosurgery appointment and medicaid   2) for your pain   - try naproxen  (Aleve ) in the morning  - acetaminophen  1000 mg in the afternoon  - acetaminophen  1000 mg at bedtime  - do not exceed more than 3000 mg of tylenol  in one day   - I called in gabapentin  to your pharmacy, you may take one tablet (300 mg) twice a day as needed. I recommend trying this medicine at night because it can make you feel drowsy   3) for your ED  - I called in Viagra  to your pharmacy   Congratulations on your abstinence from substances, you can do this!  If you have any questions please feel free to the call the clinic at anytime at [phone].  Have a blessed day,  Dr. Charmayne

## 2024-06-21 NOTE — Progress Notes (Signed)
 "  Established Patient Office Visit  Subjective   Patient ID: Zachary Romero, male    DOB: 04-27-1971  Age: 54 y.o. MRN: [id number]  Here for follow up regarding ED, recurrent back pain with left foot drop and financial assistance.   See medical history, problem based plan and assessment below.     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  HENT:     Nose: Nose normal.     Mouth/Throat:     Mouth: Mucous membranes are moist.  Eyes:     General: No scleral icterus.    Conjunctiva/sclera: Conjunctivae normal.  Pulmonary:     Effort: Pulmonary effort is normal.  Chest:     Comments: Negative for gynecomastia Neurological:     Mental Status: He is alert and oriented to person, place, and time.  Psychiatric:        Mood and Affect: Mood normal.        Behavior: Behavior normal.        Thought Content: Thought content normal.        Judgment: Judgment normal.      No results found for any visits on 06/21/24.  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 Malcolm. At LOV in 04/2024, recommended for him to follow up with NS as he has not seen them since the surgery. Today he is having worsening left sided back pain with sciatic-like pain down his leg with symptoms of persistent foot drop. He has not called NS and reiterates that he has outstanding bills with them. He has previously had Medicaid but does not currently. At LOV was referred to PT/OT and for orthotics, he has followed up with both and is going to the [facility] after this appointment. Discussed the importance of re-establishing with NS. Will place referral today and help him with pain management in the interim. He has been taking NSAIDs since discharged from prison on 12/20. I recommended he alternative  between NSAIDs and acetaminophen , additionally will prescribe gabapentin . Counseled on side effects such as drowsiness and fatigue. I also urged him to stop lifting weights at the gym.  Plan NS referral  Naproxen  in AM, acetaminophen  1000 mg in afternoon and another 1000 mg at bedtime  Gabapentin  300 mg BID PRN Orders:   Ambulatory referral to Neurosurgery   gabapentin  (NEURONTIN ) 300 MG capsule; Take 1 capsule (300 mg total) by mouth 2 (two) times daily as needed.  Erectile dysfunction, unspecified erectile dysfunction type He says this has been a problem since after the surgery in 2022. He does not have morning erections, denies decreased libido, denies breast tenderness or increase in size. He does think his testicles are smaller than they had been previously. He used to take anabolic steroids, for weight lifting, but did not get these medicines from a healthcare provider. He has not taken any for over two  years. Last testosterone  178 in 11/2022. Him and partner are engaged and anticipating sexual activity, he requests Viagra  as this has worked for him in the past. I suspect his ED is multifactorial, likely 2/2 hormonal deficiency in the setting of previous steroid use and alcohol use disorder. There could also be a neurogenic component from his worsening back pain with sciatica and left foot drop or possible vascular given his lack of morning erections.  Orders:   sildenafil  (VIAGRA ) 100 MG tablet; Take 1 tablet (100 mg total) by mouth as needed for erectile dysfunction.  Advised to contact social worker Placed social work referral today for assistance obtaining Medicaid. He had Medicaid before after working with a child psychotherapist in the hospital although does not have it currently and is unsure how to navigate this process. Will hold off on labs today.  Plan  [facility] referral, social work for financial assistance  Orders:   AMB Referral [facility] Care Management  Alcohol use Had been abstinent since admitted to prison on 09/17/2023. He says that he went to AA meeting this morning. Encouraged him to continue with alcohol abstinence as this will improve multiple aspects of his health.  Plan Continue AA    Health care maintenance His dad had prostate cancer and he is interested in being tested. PSA in 10/2022 1.  Briefly discussed the mixed recommendations for screening. Today he is asymptomatic. Plan to continue conversation at future follow up.     Return in about 3 months (around 09/19/2024) for fu .    Viktoria King, DO "

## 2024-06-22 NOTE — Progress Notes (Signed)
 Internal Medicine Clinic Attending  Case discussed with the resident at the time of the visit.  We reviewed the resident's history and exam and pertinent patient test results.  I agree with the assessment, diagnosis, and plan of care documented in the resident's note.

## 2024-06-27 NOTE — Progress Notes (Unsigned)
 "  Referring Physician:  Rosan Dayton BROCKS, DO [address], [address] [place],  [place] [address]  Primary Physician:  Jolaine Pac, DO  History of Present Illness: 07/02/2024 Mr. Zachary Romero has a history of ED, prediabetes, hyperlipidemia, alcohol use, substance abuse, history of squamous cell carcinoma, left foot drop.   He has been clean for years and is not using alcohol/drugs.   History of L4-L5 microdiscectomy 09/22/20 by Dr. Louis. Pain did improve after this surgery but he was left with a left foot drop.   He feels like his preop symptoms are back. He has 3-6 month history of constant left sided LBP with left posterior/lateral leg pain to his ankle. He has numbness, tingling, and weakness in left leg. He still has left foot drop as above. Some relief with laying on his stomach and changing positions.   He saw [facility] and is getting a brace for his left foot drop.   Tobacco use: Does not smoke.   Bowel/Bladder Dysfunction: none  Conservative measures:  Physical therapy: no recent PT.  Multimodal medical therapy including regular antiinflammatories:  Tylenol , Naproxen , Gabapentin  Injections:   none  Past Surgery:  09/22/2020- LEFT LUMBAR FOUR - LUMABR FIVE LAMINECTOMY WITH MICRODISCECTOMY by Dr. Louis Zachary ORN [place] has no symptoms of cervical myelopathy.  The symptoms are causing a significant impact on the patient's life.   Review of Systems:  A 10 point review of systems is negative, except for the pertinent positives and negatives detailed in the HPI.  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    Substance abuse [facility])     Past Surgical History: Past Surgical History:  Procedure  Laterality Date   FACIAL FRACTURE SURGERY  2007   I & D EXTREMITY Right 11/01/2022   Procedure: IRRIGATION AND DEBRIDEMENT OF HAND;  Surgeon: Murrell Drivers, MD;  Location: [facility] OR;  Service: Orthopedics;  Laterality: Right;   LUMBAR LAMINECTOMY/DECOMPRESSION MICRODISCECTOMY Left 09/22/2020   Procedure: LEFT LUMBAR FOUR - LUMABR FIVE LAMINECTOMY WITH MICRODISCECTOMY;  Surgeon: Louis Shove, MD;  Location: [facility] OR;  Service: Neurosurgery;  Laterality: Left;   TRACHEOSTOMY     then revision    Allergies: Allergies as of 07/02/2024   (No Known Allergies)    Medications: Outpatient Encounter Medications as of 07/02/2024  Medication Sig   acetaminophen  (TYLENOL ) 325 MG tablet Take 1,000 mg by mouth every 6 (six) hours as needed.   gabapentin  (NEURONTIN ) 300 MG capsule Take 1 capsule (300 mg total) by mouth 2 (two) times daily as needed.   naproxen  sodium (ALEVE ) 220 MG tablet Take 220 mg by mouth 2 (two) times daily as needed.   sildenafil  (VIAGRA ) 100 MG tablet Take 1 tablet (100 mg total) by mouth as needed for erectile dysfunction.   No facility-administered encounter medications on file as of 07/02/2024.    Social History: Social History[1]  Family Medical History: Family History  Problem Relation Age of Onset   Ovarian cancer Mother    Liver disease Neg Hx    Esophageal cancer Neg Hx    Colon cancer Neg Hx     Physical Examination: Vitals:  07/02/24 1102  BP: 136/80    General: Patient is well developed, well nourished, calm, collected, and in no apparent distress. Attention to examination is appropriate.  Respiratory: Patient is breathing without any difficulty.   NEUROLOGICAL:     Awake, alert, oriented to person, place, and time.  Speech is clear and fluent. Fund of knowledge is appropriate.   Cranial Nerves: Pupils equal round and reactive to light.  Facial tone is symmetric.    No posterior lumbar tenderness.   No abnormal lesions on exposed skin.    Strength: Side Biceps Triceps Deltoid Interossei Grip Wrist Ext. Wrist Flex.  R 5 5 5 5 5 5 5   L 5 5 5 5 5 5 5    Side Iliopsoas Quads Hamstring PF DF EHL  R 5 5 5 5 5 5   L 5 5 5 5 3 3    Reflexes are 2+ and symmetric at the biceps, brachioradialis, patella and achilles. Left achilles DTR is absent.   Good ROM of both hips with no pain.   Hoffman's is absent.  Clonus is not present.   Bilateral upper and lower extremity sensation is intact to light touch.     He has abnormal gait due to foot drop on left.   Medical Decision Making  Imaging: No recent lumbar imaging.   Assessment and Plan: Mr. Nile has a history of L4-L5 microdiscectomy 09/22/20 by Dr. Louis. Pain did improve after this surgery but he was left with a left foot drop.   He feels like his preop symptoms are back. He has 3-6 month history of constant left sided LBP with left posterior/lateral leg pain to his ankle. He has numbness, tingling, and weakness in left leg.   No recent lumbar imaging, but back and left leg pain appear to be lumbar mediated.   He continues with left foot drop on exam.   Treatment options discussed with patient and following plan made:   - MRI of lumbar spine to further evaluate back and left leg pain.  - He can continue on neurontin  from other providers.  - He can continue on prn OTC aleve  as well. Take with food.  - Will schedule phone or MyChart visit to review MRI results once I get them back.   I spent a total of 35 minutes in face-to-face and non-face-to-face activities related to this patient's care today including review of outside records, review of imaging, review of symptoms, physical exam, discussion of differential diagnosis, discussion of treatment options, and documentation.   Thank you for involving me in the care of this patient.   Glade Boys PA-C Dept. of Neurosurgery      [1]  Social History Tobacco Use   Smoking status: Never   Smokeless tobacco: Never   Vaping Use   Vaping status: Never Used  Substance Use Topics   Alcohol use: Yes    Alcohol/week: 5.0 standard drinks of alcohol    Types: 5 Cans of beer per week   Drug use: Yes    Types: Cocaine   "

## 2024-06-28 ENCOUNTER — Telehealth: Payer: Self-pay | Admitting: *Deleted

## 2024-07-01 ENCOUNTER — Other Ambulatory Visit: Payer: Self-pay | Admitting: Medical Genetics

## 2024-07-01 ENCOUNTER — Other Ambulatory Visit: Payer: Self-pay

## 2024-07-02 ENCOUNTER — Encounter: Payer: Self-pay | Admitting: Orthopedic Surgery

## 2024-07-02 ENCOUNTER — Ambulatory Visit: Payer: Self-pay | Admitting: Orthopedic Surgery

## 2024-07-02 VITALS — BP 136/80 | Ht 79.0 in | Wt 258.4 lb

## 2024-07-02 DIAGNOSIS — M5442 Lumbago with sciatica, left side: Secondary | ICD-10-CM

## 2024-07-02 DIAGNOSIS — G8929 Other chronic pain: Secondary | ICD-10-CM

## 2024-07-02 DIAGNOSIS — M21372 Foot drop, left foot: Secondary | ICD-10-CM

## 2024-07-02 DIAGNOSIS — M5416 Radiculopathy, lumbar region: Secondary | ICD-10-CM

## 2024-07-02 DIAGNOSIS — Z9889 Other specified postprocedural states: Secondary | ICD-10-CM

## 2024-07-02 DIAGNOSIS — M79641 Pain in right hand: Secondary | ICD-10-CM

## 2024-07-02 NOTE — Patient Instructions (Signed)
 It was so nice to see you today. Thank you so much for coming in.    I want to get an MRI of your lower back to look into things further. We will get this approved through your insurance and [facility] will call you to schedule the appointment. Ask about your patient responsibility. You do not need to pay this prior to getting MRI, they can bill you.   After you have the MRI, it can take 14-28 days for me to get the results back. If I don't have them in 2 weeks, we will call to try to get the results.   Once I have the results, we will call you to schedule a follow up MyChart or phone visit with me to review them.   I want you to see Dr. Murrell in ortho for your hand. They should call you to schedule an appointment or you can call them at [phone].   Please do not hesitate to call if you have any questions or concerns. You can also message me in MyChart.   Glade Boys PA-C [phone]     The physicians and staff at [facility] Neurosurgery at [facility] are committed to providing excellent care. You may receive a survey asking for feedback about your experience at our office. We value you your feedback and appreciate you taking the time to to fill it out. The [facility] leadership team is also available to discuss your experience in person, feel free to contact us  [phone].

## 2024-07-04 ENCOUNTER — Other Ambulatory Visit: Payer: Self-pay

## 2024-07-04 DIAGNOSIS — F1090 Alcohol use, unspecified, uncomplicated: Secondary | ICD-10-CM

## 2024-07-04 NOTE — Patient Instructions (Signed)
 Visit Information  Thank you for taking time to visit with me today. Please don't hesitate to contact me if I can be of assistance to you before our next scheduled appointment.  Our next appointment is by telephone on 07/18/24 at 2 PM Please call the care guide team at [phone] if you need to cancel or reschedule your appointment.   Following is a copy of your care plan:   Goals Addressed             This Visit's Progress    VBCI RN Care Plan   On track    Problems:  Care Coordination needs related to Medicaid establishment  Chronic Disease Management support and education needs related to Depression  Goal: Over the next 30 days the Patient will continue to work with RN Care Manager and/or Social Worker to address care management and care coordination needs related to establishing with Medicaid as evidenced by adherence to care management team scheduled appointments     work with social worker to address Mental Health Concerns  related to the management of Depression as evidenced by review of electronic medical record and patient or social worker report      Interventions:   Evaluation of current treatment plan related to Depression, establishing with Medicaid self-management and patient's adherence to plan as established by provider. Discussed plans with patient for ongoing care management follow up and provided patient with direct contact information for care management team Reviewed scheduled/upcoming provider appointments including MRI 07/11/24 Social Work referral for depression Screening for signs and symptoms of depression related to chronic disease state  Assessed social determinant of health barriers Reviewed current status of Medicaid application, which patient states will hopefully be settled in the next day or two. Discussed needs after Medicaid is active, including transportation, dermatology follow-up, follow-up with the [facility] for foot brace, PT/OT, and  assistive devices Reviewed care team roles  Patient Self-Care Activities:  Attend all scheduled provider appointments Call provider office for new concerns or questions  Perform all self care activities independently  Perform IADL's (shopping, preparing meals, housekeeping, managing finances) independently Take medications as prescribed   Work with the social worker to address care coordination needs and will continue to work with the clinical team to address health care and disease management related needs  Plan:  Telephone follow up appointment with care management team member scheduled for:  07/18/24 at 2 PM             Please call the Suicide and Crisis Lifeline: 988 call 1-800-273-TALK (toll free, 24 hour hotline) if you are experiencing a Mental Health or Behavioral Health Crisis or need someone to talk to.  Patient verbalized understanding of Care plan and visit instructions communicated this visit  Rosaline Finlay, RN MSN [place]  [facility] RN Care Manager Direct Dial: [phone]  Fax: [phone]

## 2024-07-04 NOTE — Patient Outreach (Signed)
 Complex Care Management   Visit Note  07/04/2024  Name:  Zachary Romero MRN: [id number] DOB: 1970-12-24  Situation: Referral received for Complex Care Management related to SDOH Barriers:  Transportation and establishing with Medicaid I obtained verbal consent from Patient.  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    Substance abuse (HCC)     Assessment: Patient Reported Symptoms:  Cognitive Cognitive Status: Able to follow simple commands, Alert and oriented to person, place, and time, Normal speech and language skills Cognitive/Intellectual Conditions Management [RPT]: None reported or documented in medical history or problem list   Health Maintenance Behaviors: Annual physical exam, Healthy diet Health Facilitated by: Pain control, Rest, Healthy diet  Neurological Neurological Review of Symptoms: Weakness, Numbness (left sided) Neurological Management Strategies: Coping strategies, Medication therapy  HEENT HEENT Symptoms Reported: No symptoms reported      Cardiovascular Cardiovascular Symptoms Reported: Swelling in legs or feet Does patient have uncontrolled Hypertension?: No Cardiovascular Comment: Patient reports chronic swelling in the left leg  Respiratory Respiratory Symptoms Reported: No symptoms reported    Endocrine Endocrine Symptoms Reported: Weakness or fatigue Is patient diabetic?: No    Gastrointestinal Gastrointestinal Symptoms Reported: No symptoms reported Additional Gastrointestinal Details: Patient reports a good appetite. 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 Patient plans to schedule a follow-up visit after Medicaid is active    Musculoskeletal Musculoskelatal Symptoms Reviewed: Other, Back pain, Limited mobility, Difficulty walking Other Musculoskeletal Symptoms: Back surgery in 2022 resulting in severe drop foot and limited mobility in his L foot. Patient has had recent visit with neurosurgery and is scheduled for MRI 07/11/24. Limited mobility of middle finger on the right hand, has been referred to orthopedic surgery. Pain greatly contributing to difficulty walking per patient Additional Musculoskeletal Details: Patient notes that he has been seen at [facility] for a brace for L foot drop, but is unable to get device until Medicaid is active. Patient reports he is waiting PT/OT until Medicaid is active as well Musculoskeletal Management Strategies: Coping strategies, Routine screening Musculoskeletal Comment: Patient reports a fall 2 days ago, I catch my toe. He reports he stumbles frequently. Patient denies injury from falling Falls in the past year?: Yes Number of falls in past year: 2 or more Was there an injury with Fall?: No Fall Risk Category Calculator: 2 Patient Fall Risk Level: Moderate Fall Risk Patient at Risk for Falls Due to: Impaired mobility, History of fall(s), Orthopedic patient Fall risk Follow up: Falls evaluation completed, Education provided, Falls prevention discussed  Psychosocial Psychosocial Symptoms Reported: Depression - if selected complete PHQ 2-9 Additional Psychological Details: Patient reports he is open to LCSW referral. Scheduled Behavioral Management Strategies: Abstinence from substances, Coping strategies, Support group (Daily AA  meetings) Major Change/Loss/Stressor/Fears (CP): Medical condition, self, Legal concerns, Relationship concerns Behaviors When  Feeling Stressed/Fearful: Watch TV, writing Techniques to [employer] with Loss/Stress/Change: Diversional activities Quality of Family Relationships: non-existent (strained relationship with children) Do you feel physically threatened by others?: No    07/04/2024    PHQ2-9 Depression Screening   Little interest or pleasure in doing things Not at all  Feeling down, depressed, or hopeless More than half the days  PHQ-2 - Total Score 2  Trouble falling or staying asleep, or sleeping too much Nearly every day  Feeling tired or having little energy Not at all  Poor appetite or overeating  Not at all  Feeling bad about yourself - or that you are a failure or have let yourself or your family down Nearly every day  Trouble concentrating on things, such as reading the newspaper or watching television Not at all  Moving or speaking so slowly that other people could have noticed.  Or the opposite - being so fidgety or restless that you have been moving around a lot more than usual Not at all  Thoughts that you would be better off dead, or hurting yourself in some way Not at all  PHQ2-9 Total Score 8  If you checked off any problems, how difficult have these problems made it for you to do your work, take care of things at home, or get along with other people Somewhat difficult  Depression Interventions/Treatment  (LCSW referral)    There were no vitals filed for this visit. Pain Scale: 0-10 Pain Score: 8  Pain Type: Chronic pain Pain Location: Back (right where they did the laminectomy) Pain Orientation: Lower (radiating into left leg)  Medications Reviewed Today     Reviewed by Arno Rosaline SQUIBB, RN (Registered Nurse) on 07/04/24 at 1353  Med List Status: <None>   Medication Order Taking? Sig Documenting Provider Last Dose Status Informant  acetaminophen  (TYLENOL ) 325  MG tablet [id number]  Take 1,000 mg by mouth every 6 (six) hours as needed. [provider]  Active   gabapentin  (NEURONTIN ) 300 MG capsule [id number] Yes Take 1 capsule (300 mg total) by mouth 2 (two) times daily as needed. Charmayne Holmes, DO  Active   naproxen  sodium (ALEVE ) 220 MG tablet [id number]  Take 220 mg by mouth 2 (two) times daily as needed. [provider]  Active   sildenafil  (VIAGRA ) 100 MG tablet [id number] Yes Take 1 tablet (100 mg total) by mouth as needed for erectile dysfunction. Charmayne Holmes, DO  Active   Med List Note Beverlee Reyes LELON Bishop 04/15/17 9443): Doesn't have a pharmacy he uses regularly             Recommendation:   Referral to: LCSW Continue Current Plan of Care  Follow Up Plan:   Telephone follow up appointment date/time:  07/18/24 at 2 PM Referral to Licensed Clinical Social Worker  Rosaline Arno, RN MSN [place]  [facility] Population Health RN Care Manager Direct Dial: [phone]  Fax: [phone]

## 2024-07-05 ENCOUNTER — Encounter (HOSPITAL_COMMUNITY): Payer: Self-pay | Admitting: Emergency Medicine

## 2024-07-05 ENCOUNTER — Emergency Department (HOSPITAL_COMMUNITY)
Admission: EM | Admit: 2024-07-05 | Discharge: 2024-07-05 | Disposition: A | Discharge status: Home / Self Care | Payer: Self-pay | Source: Home / Self Care | Attending: Emergency Medicine | Admitting: Emergency Medicine

## 2024-07-05 ENCOUNTER — Other Ambulatory Visit: Payer: Self-pay

## 2024-07-05 DIAGNOSIS — W540XXA Bitten by dog, initial encounter: Secondary | ICD-10-CM

## 2024-07-05 MED ORDER — AMOXICILLIN-POT CLAVULANATE 875-125 MG PO TABS: 1.0000 | ORAL_TABLET | Freq: Two times a day (BID) | ORAL | 0 refills | Status: AC

## 2024-07-05 MED ORDER — AMOXICILLIN-POT CLAVULANATE 875-125 MG PO TABS
1.0000 | ORAL_TABLET | Freq: Once | ORAL | Status: AC
  Administered 2024-07-05: 1 via ORAL
  Filled 2024-07-05: qty 1

## 2024-07-05 NOTE — Discharge Instructions (Addendum)
 Please read and follow all provided instructions.  Your diagnoses today include:  1. Dog bite, initial encounter    Tests performed today include: Vital signs. See below for your results today.   Medications prescribed:  Augmentin  - antibiotic  You have been prescribed an antibiotic medicine: take the entire course of medicine even if you are feeling better. Stopping early can cause the antibiotic not to work.  Take any prescribed medications only as directed.   Home care instructions:  Follow any educational materials contained in this packet. Keep affected area above the level of your heart when possible. Wash area gently twice a day with warm soapy water. Do not apply alcohol or hydrogen peroxide. Cover the area if it draining or weeping.   Follow-up instructions: Follow-up with your doctor as needed.  Return instructions:  Return to the Emergency Department if you have: Fever Worsening symptoms Worsening pain Worsening swelling Redness of the skin that moves away from the affected area, especially if it streaks away from the affected area  Any other emergent concerns  Your vital signs today were: BP 112/76 (BP Location: Right Arm)   Pulse 69   Temp 97.7 F (36.5 C) (Oral)   Resp 17   SpO2 97%  If your blood pressure (BP) was elevated above 135/85 this visit, please have this repeated by your doctor within one month. --------------

## 2024-07-05 NOTE — ED Provider Notes (Cosign Needed)
 " [place] EMERGENCY DEPARTMENT AT [facility] Provider Note   CSN: [id number] Arrival date & time: 07/05/24  2010     Patient presents with: Animal Bite   Zachary Romero is a 54 y.o. male.   Patient presents to the emergency department for evaluation of dog bite that occurred around 7 PM tonight.  Patient was approached by a dog which bit him on the left posterior thigh.  Patient was evaluated by EMS.  Animal control was called and is quarantine the animal.  Per patient report, they validated dog's rabies vaccine.  Patient is concerned about infection given previous hand infection requiring surgery and previous lumbar surgery.  Last tetanus confirmed to be June 2024 in epic.  Bandage applied prior to arrival.       Prior to Admission medications  Medication Sig Start Date End Date Taking? Authorizing Provider  acetaminophen  (TYLENOL ) 325 MG tablet Take 1,000 mg by mouth every 6 (six) hours as needed.    [provider]  gabapentin  (NEURONTIN ) 300 MG capsule Take 1 capsule (300 mg total) by mouth 2 (two) times daily as needed. 06/21/24 07/21/24  Charmayne Holmes, DO  naproxen  sodium (ALEVE ) 220 MG tablet Take 220 mg by mouth 2 (two) times daily as needed.    [provider]  sildenafil  (VIAGRA ) 100 MG tablet Take 1 tablet (100 mg total) by mouth as needed for erectile dysfunction. 06/21/24 06/21/25  Charmayne Holmes, DO    Allergies: Patient has no known allergies.    Review of Systems  Updated Vital Signs BP 112/76 (BP Location: Right Arm)   Pulse 69   Temp 97.7 F (36.5 C) (Oral)   Resp 17   SpO2 97%   Physical Exam Vitals and nursing note reviewed.  Constitutional:      Appearance: He is well-developed.  HENT:     Head: Normocephalic and atraumatic.  Eyes:     Conjunctiva/sclera: Conjunctivae normal.  Pulmonary:     Effort: No respiratory distress.  Musculoskeletal:     Cervical back: Normal range of motion and neck supple.     Comments: Left  posterior thigh, dog bite, no deep laceration, more of an abrasion as pictured.  No surrounding erythema.  Patient with full range of motion of the knee and hip.  Skin:    General: Skin is warm and dry.  Neurological:     Mental Status: He is alert.       (all labs ordered are listed, but only abnormal results are displayed) Labs Reviewed - No data to display  EKG: None  Radiology: No results found.   Procedures   Medications Ordered in the ED  amoxicillin -clavulanate (AUGMENTIN ) 875-125 MG per tablet 1 tablet (has no administration in time range)   ED Course  Patient seen and examined. History obtained directly from patient.    Labs/EKG: None ordered  Imaging: None ordered  Medications/Fluids: Ordered: Augmentin .  No indication for rabies prophylaxis as location of dog is known and previous vaccine was verified per patient report.  Most recent vital signs reviewed and are as follows: BP 112/76 (BP Location: Right Arm)   Pulse 69   Temp 97.7 F (36.5 C) (Oral)   Resp 17   SpO2 97%   Initial impression: Superficial dog bite of left posterior thigh will provide 5 days of prophylaxis.  Home treatment plan: Wound care, Augmentin   Return instructions discussed with patient: Pt urged to return with worsening pain, worsening swelling, expanding area of  redness or streaking up extremity, fever, or any other concerns.  Urged to take complete course of antibiotics as prescribed. Pt verbalizes understanding and agrees with plan.  Follow-up instructions discussed with patient: PCP as needed.                                   Medical Decision Making Risk Prescription drug management.   Patient with dog bite, superficial wound, appears low risk.  Patient will be given Augmentin .  Tetanus is up-to-date.  Low concern for foreign body, fracture or complication.  Patient does not need rabies prophylaxis.     Final diagnoses:  Dog bite, initial encounter    ED  Discharge Orders     None          Desiderio Chew, [place] 07/05/24 2135  "

## 2024-07-05 NOTE — ED Triage Notes (Signed)
 Pt bib ems for dog bite to left thigh, per ems looks more like an abrasion and not puncture wounds. Unknown if the dog has had rabies vaccine.  Animal control already called.

## 2024-07-11 ENCOUNTER — Other Ambulatory Visit: Payer: Self-pay

## 2024-07-15 ENCOUNTER — Telehealth: Payer: Self-pay | Admitting: Licensed Clinical Social Worker

## 2024-07-16 ENCOUNTER — Telehealth: Payer: Self-pay

## 2024-07-18 ENCOUNTER — Telehealth: Payer: Self-pay

## 2024-09-19 ENCOUNTER — Ambulatory Visit: Payer: Self-pay | Admitting: Student
